# Patient Record
Sex: Female | Born: 1975 | Race: White | Hispanic: No | Marital: Single | Smoking: Former smoker
Health system: Southern US, Community
[De-identification: ages and names within clinical notes are randomized; demographics above are authoritative.]

## PROBLEM LIST (undated history)

## (undated) ENCOUNTER — Emergency Department (HOSPITAL_COMMUNITY): Discharge status: Absent without leave | Payer: 59

## (undated) DIAGNOSIS — J189 Pneumonia, unspecified organism: Secondary | ICD-10-CM

## (undated) DIAGNOSIS — F909 Attention-deficit hyperactivity disorder, unspecified type: Secondary | ICD-10-CM

## (undated) DIAGNOSIS — F419 Anxiety disorder, unspecified: Secondary | ICD-10-CM

## (undated) DIAGNOSIS — F319 Bipolar disorder, unspecified: Secondary | ICD-10-CM

## (undated) HISTORY — DX: Attention-deficit hyperactivity disorder, unspecified type: F90.9

## (undated) HISTORY — PX: CYST EXCISION: SHX5701

---

## 1999-09-01 ENCOUNTER — Emergency Department (HOSPITAL_COMMUNITY): Admission: EM | Admit: 1999-09-01 | Discharge: 1999-09-01 | Payer: Self-pay | Admitting: Emergency Medicine

## 1999-09-02 ENCOUNTER — Encounter: Payer: Self-pay | Admitting: *Deleted

## 1999-09-02 ENCOUNTER — Ambulatory Visit (HOSPITAL_COMMUNITY): Admission: AD | Admit: 1999-09-02 | Discharge: 1999-09-02 | Payer: Self-pay | Admitting: *Deleted

## 1999-09-02 ENCOUNTER — Encounter (INDEPENDENT_AMBULATORY_CARE_PROVIDER_SITE_OTHER): Payer: Self-pay

## 1999-11-11 ENCOUNTER — Ambulatory Visit (HOSPITAL_COMMUNITY): Admission: RE | Admit: 1999-11-11 | Discharge: 1999-11-11 | Payer: Self-pay | Admitting: Obstetrics and Gynecology

## 1999-11-20 HISTORY — PX: DILATION AND CURETTAGE OF UTERUS: SHX78

## 2002-05-24 ENCOUNTER — Emergency Department (HOSPITAL_COMMUNITY): Admission: EM | Admit: 2002-05-24 | Discharge: 2002-05-24 | Payer: Self-pay | Admitting: Emergency Medicine

## 2003-11-23 ENCOUNTER — Emergency Department (HOSPITAL_COMMUNITY): Admission: EM | Admit: 2003-11-23 | Discharge: 2003-11-23 | Payer: Self-pay

## 2010-02-24 ENCOUNTER — Emergency Department (HOSPITAL_COMMUNITY): Admission: EM | Admit: 2010-02-24 | Discharge: 2010-02-24 | Payer: Self-pay | Admitting: Family Medicine

## 2010-06-13 ENCOUNTER — Other Ambulatory Visit: Admission: RE | Admit: 2010-06-13 | Discharge: 2010-06-13 | Payer: Self-pay | Admitting: Family Medicine

## 2011-04-06 NOTE — Op Note (Signed)
[facility]  Patient:    Cathy Payne                        MRN: [id number] Proc. Date: 11/11/99 Adm. Date:  [id number] Attending:  Lendon Colonel                           Operative Report  PREOPERATIVE DIAGNOSIS:       Right Bartholins gland abscess.  POSTOPERATIVE DIAGNOSIS:      Right Bartholins gland abscess.  OPERATION:                    Marsupialization of Bartholins gland abscess.  SURGEON:                      Katherine Roan, M.D.  ASSISTANT:  ANESTHESIA:  ESTIMATED BLOOD LOSS:  DESCRIPTION OF PROCEDURE:     The patient was placed in the lithotomy position nd prepped and draped in the usual fashion.  A small oval segment of vagina was excised and the Bartholins gland abscess was opened and drained.  The vagina was then sutured in two places with 3-0 chromic suture.  Hemostasis was secure. The Bartholins was infiltrated with about 10 cc of 0.5% Marcaine with epinephrine and the needlepoint Bovie was used to cauterize the vaginal mucosa.  [name] tolerated this procedure well and was sent to the recovery room in good condition. DD:  11/11/99 TD:  11/12/99 Job: [id number]

## 2013-07-03 ENCOUNTER — Other Ambulatory Visit: Payer: Self-pay | Admitting: Family Medicine

## 2013-07-03 ENCOUNTER — Other Ambulatory Visit (HOSPITAL_COMMUNITY)
Admission: RE | Admit: 2013-07-03 | Discharge: 2013-07-03 | Disposition: A | Discharge status: Home / Self Care | Payer: 59 | Source: Ambulatory Visit | Attending: Family Medicine | Admitting: Family Medicine

## 2013-07-03 DIAGNOSIS — Z124 Encounter for screening for malignant neoplasm of cervix: Secondary | ICD-10-CM | POA: Insufficient documentation

## 2014-05-26 ENCOUNTER — Other Ambulatory Visit: Payer: Self-pay | Admitting: Nurse Practitioner

## 2014-05-26 DIAGNOSIS — Z1231 Encounter for screening mammogram for malignant neoplasm of breast: Secondary | ICD-10-CM

## 2014-06-09 ENCOUNTER — Ambulatory Visit
Admission: RE | Admit: 2014-06-09 | Discharge: 2014-06-09 | Disposition: A | Discharge status: Home / Self Care | Payer: 59 | Source: Ambulatory Visit | Attending: Nurse Practitioner | Admitting: Nurse Practitioner

## 2014-06-09 DIAGNOSIS — Z1231 Encounter for screening mammogram for malignant neoplasm of breast: Secondary | ICD-10-CM

## 2014-06-10 ENCOUNTER — Other Ambulatory Visit: Payer: Self-pay | Admitting: Nurse Practitioner

## 2014-06-10 DIAGNOSIS — R928 Other abnormal and inconclusive findings on diagnostic imaging of breast: Secondary | ICD-10-CM

## 2014-06-18 ENCOUNTER — Ambulatory Visit
Admission: RE | Admit: 2014-06-18 | Discharge: 2014-06-18 | Disposition: A | Discharge status: Home / Self Care | Payer: 59 | Source: Ambulatory Visit | Attending: Nurse Practitioner | Admitting: Nurse Practitioner

## 2014-06-18 DIAGNOSIS — R928 Other abnormal and inconclusive findings on diagnostic imaging of breast: Secondary | ICD-10-CM

## 2014-10-28 ENCOUNTER — Emergency Department (HOSPITAL_COMMUNITY)
Admission: EM | Admit: 2014-10-28 | Discharge: 2014-10-28 | Disposition: A | Discharge status: Home / Self Care | Payer: 59 | Attending: Emergency Medicine | Admitting: Emergency Medicine

## 2014-10-28 ENCOUNTER — Encounter (HOSPITAL_COMMUNITY): Payer: Self-pay | Admitting: *Deleted

## 2014-10-28 DIAGNOSIS — Z720 Tobacco use: Secondary | ICD-10-CM | POA: Diagnosis not present

## 2014-10-28 DIAGNOSIS — F419 Anxiety disorder, unspecified: Secondary | ICD-10-CM | POA: Diagnosis not present

## 2014-10-28 DIAGNOSIS — F3131 Bipolar disorder, current episode depressed, mild: Secondary | ICD-10-CM | POA: Diagnosis not present

## 2014-10-28 DIAGNOSIS — F329 Major depressive disorder, single episode, unspecified: Secondary | ICD-10-CM | POA: Diagnosis present

## 2014-10-28 HISTORY — DX: Bipolar disorder, unspecified: F31.9

## 2014-10-28 HISTORY — DX: Anxiety disorder, unspecified: F41.9

## 2014-10-28 LAB — CBC
HCT: 44.5 % (ref 36.0–46.0)
Hemoglobin: 15 g/dL (ref 12.0–15.0)
MCH: 29.9 pg (ref 26.0–34.0)
MCHC: 33.7 g/dL (ref 30.0–36.0)
MCV: 88.6 fL (ref 78.0–100.0)
PLATELETS: 276 10*3/uL (ref 150–400)
RBC: 5.02 MIL/uL (ref 3.87–5.11)
RDW: 12.6 % (ref 11.5–15.5)
WBC: 9.1 10*3/uL (ref 4.0–10.5)

## 2014-10-28 LAB — COMPREHENSIVE METABOLIC PANEL
ALT: 20 U/L (ref 0–35)
AST: 22 U/L (ref 0–37)
Albumin: 3.7 g/dL (ref 3.5–5.2)
Alkaline Phosphatase: 78 U/L (ref 39–117)
Anion gap: 13 (ref 5–15)
BUN: 12 mg/dL (ref 6–23)
CALCIUM: 9.2 mg/dL (ref 8.4–10.5)
CO2: 24 mEq/L (ref 19–32)
CREATININE: 0.92 mg/dL (ref 0.50–1.10)
Chloride: 96 mEq/L (ref 96–112)
GFR calc Af Amer: >90 mL/min (ref >90)
GFR calc non Af Amer: 79 mL/min — ABNORMAL LOW (ref >90)
Glucose, Bld: 87 mg/dL (ref 70–99)
Potassium: 3.7 mEq/L (ref 3.7–5.3)
SODIUM: 133 meq/L — AB (ref 137–147)
TOTAL PROTEIN: 7.9 g/dL (ref 6.0–8.3)
Total Bilirubin: 0.2 mg/dL — ABNORMAL LOW (ref 0.3–1.2)

## 2014-10-28 LAB — RAPID URINE DRUG SCREEN, HOSP PERFORMED
AMPHETAMINES: NOT DETECTED
Barbiturates: NOT DETECTED
Benzodiazepines: NOT DETECTED
Cocaine: NOT DETECTED
OPIATES: NOT DETECTED
Tetrahydrocannabinol: NOT DETECTED

## 2014-10-28 LAB — ACETAMINOPHEN LEVEL: Acetaminophen (Tylenol), Serum: <15 ug/mL (ref 10–30)

## 2014-10-28 LAB — ETHANOL

## 2014-10-28 LAB — SALICYLATE LEVEL: Salicylate Lvl: <2 mg/dL — ABNORMAL LOW (ref 2.8–20.0)

## 2014-10-28 MED ORDER — ACETAMINOPHEN 325 MG PO TABS: 650.0000 mg | ORAL_TABLET | ORAL | Status: DC | PRN

## 2014-10-28 MED ORDER — LORAZEPAM 1 MG PO TABS: 1.0000 mg | ORAL_TABLET | Freq: Three times a day (TID) | ORAL | Status: DC | PRN

## 2014-10-28 MED ORDER — ONDANSETRON HCL 4 MG PO TABS: 4.0000 mg | ORAL_TABLET | Freq: Three times a day (TID) | ORAL | Status: DC | PRN

## 2014-10-28 MED ORDER — NICOTINE 21 MG/24HR TD PT24: 21.0000 mg | MEDICATED_PATCH | Freq: Every day | TRANSDERMAL | Status: DC

## 2014-10-28 NOTE — BH Assessment (Signed)
Spoke with Elpidio AnisShari Upstill, PA-C prior to initiating assessment. Per Melvenia BeamShari, PA-C pt was upset at work today and reports she would have hurt her boss if she was able. Pt reports recent bipolar dx and being on medication for about 3 months, reports feeling stressed and at  A "breaking point."   Assessment to commence shortly.    Clista BernhardtNancy Tariyah Pendry, [facility] Triage Specialist 10/28/2014 4:11 AM
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
## 2014-10-28 NOTE — Discharge Instructions (Signed)

## 2014-10-28 NOTE — BH Assessment (Signed)
Relayed results of assessment to Donell SievertSpencer Simon, PA. Per Karleen HampshireSpencer, GeorgiaPA pt does not meet inpt criteria and can be discharged with OP resources.   Elpidio AnisShari Upstill, PA-C is in agreement with plan. Informed RN of plan.   Educated pt on how to access OP resources, what to expect from counseling and how to advocate for herself. Provided with OP resources. Discussed sx that would indicated pt should return to ED for assessment. She voiced understanding and is in agreement with plan.   Clista BernhardtNancy Syreeta Figler, [facility] Triage Specialist 10/28/2014 5:23 AM

## 2014-10-28 NOTE — ED Notes (Signed)
  AVS provided and reviewed. Understanding verbalized. Denied SI/HI/AVH. Denied pain. She offered no questions or concerns. Escorted off the unit, belongings returned and directed to the exit. Pt left in no acute distress. 

## 2014-10-28 NOTE — ED Notes (Signed)
Pt has been seen and wanded by security.  Pt has belongings.

## 2014-10-28 NOTE — ED Provider Notes (Signed)
CSN: [id number]     Arrival date & time 10/28/14  0104 History   First MD Initiated Contact with Patient 10/28/14 0326     Chief Complaint  Patient presents with  . Depression     (Consider location/radiation/quality/duration/timing/severity/associated sxs/prior Treatment) Patient is a 38 y.o. female presenting with mental health disorder. The history is provided by the patient. No language interpreter was used.  Mental Health Problem Presenting symptoms: agitation, depression and homicidal ideas   Presenting symptoms: no suicidal thoughts   Associated symptoms: anxiety   Associated symptoms comment:  She reports feelings of depression, hypersomnolence, prone to fits of anger and stress. Several days ago she had an episode of anger aimed at her employer and feels she would have hurt him if she had been able. She is followed by Dr. Toni ArthursFuller and treated for Bipolar and anxiety but does not feel the medications as helping with her current symptoms. No substance abuse issues. No suicidal ideations.   Past Medical History  Diagnosis Date  . Bipolar 1 disorder   . Anxiety    History reviewed. No pertinent past surgical history. No family history on file. History  Substance Use Topics  . Smoking status: Current Every Day Smoker  . Smokeless tobacco: Not on file  . Alcohol Use: No   OB History    No data available     Review of Systems  Constitutional: Negative for fever and chills.  HENT: Negative.   Respiratory: Negative.   Cardiovascular: Negative.   Gastrointestinal: Negative.   Musculoskeletal: Negative.   Skin: Negative.   Neurological: Negative.   Psychiatric/Behavioral: Positive for homicidal ideas, dysphoric mood and agitation. Negative for suicidal ideas. The patient is nervous/anxious.       Allergies  Review of patient's allergies indicates no known allergies.  Home Medications   Prior to Admission medications   Not on File   BP 145/94 mmHg  Pulse 116   Resp 18  SpO2 100% Physical Exam  Constitutional: She appears well-developed and well-nourished.  HENT:  Head: Normocephalic.  Neck: Normal range of motion. Neck supple.  Cardiovascular: Normal rate and regular rhythm.   Pulmonary/Chest: Effort normal and breath sounds normal.  Abdominal: Soft. Bowel sounds are normal. There is no tenderness. There is no rebound and no guarding.  Musculoskeletal: Normal range of motion.  Neurological: She is alert. No cranial nerve deficit.  Skin: Skin is warm and dry. No rash noted.  Psychiatric: Her speech is normal. Cognition and memory are normal. She exhibits a depressed mood.    ED Course  Procedures (including critical care time) Labs Review Labs Reviewed  COMPREHENSIVE METABOLIC PANEL - Abnormal; Notable for the following:    Sodium 133 (*)    Total Bilirubin 0.2 (*)    GFR calc non Af Amer 79 (*)    All other components within normal limits  SALICYLATE LEVEL - Abnormal; Notable for the following:    Salicylate Lvl <2.0 (*)    All other components within normal limits  ACETAMINOPHEN LEVEL  CBC  ETHANOL  URINE RAPID DRUG SCREEN (HOSP PERFORMED)    Imaging Review No results found.   EKG Interpretation None      MDM   Final diagnoses:  None    1. Bipolar  Will obtain TTS consultation to determine safety for discharge.  TTS consultation:  Patient stable for discharge with outpatient resources and PCP Toni Arthurs(Fuller) follow up. Able to contract for safety.     Arnoldo HookerShari A Ichelle Harral, PA-C  10/28/14 0404  Arnoldo HookerShari A Brantly Kalman, PA-C 10/28/14 [id number]  Hanley SeamenJohn L Molpus, MD 10/28/14 [phone]
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
## 2015-01-03 ENCOUNTER — Ambulatory Visit (HOSPITAL_COMMUNITY): Payer: Self-pay | Admitting: Clinical

## 2015-01-17 ENCOUNTER — Ambulatory Visit (HOSPITAL_COMMUNITY): Payer: Self-pay | Admitting: Clinical

## 2015-02-02 ENCOUNTER — Encounter (INDEPENDENT_AMBULATORY_CARE_PROVIDER_SITE_OTHER): Payer: Self-pay

## 2015-02-02 ENCOUNTER — Ambulatory Visit (INDEPENDENT_AMBULATORY_CARE_PROVIDER_SITE_OTHER): Payer: 59 | Admitting: Clinical

## 2015-02-02 ENCOUNTER — Encounter (HOSPITAL_COMMUNITY): Payer: Self-pay | Admitting: Clinical

## 2015-02-02 DIAGNOSIS — F319 Bipolar disorder, unspecified: Secondary | ICD-10-CM | POA: Diagnosis not present

## 2015-02-02 DIAGNOSIS — F909 Attention-deficit hyperactivity disorder, unspecified type: Secondary | ICD-10-CM | POA: Diagnosis not present

## 2015-02-02 DIAGNOSIS — F988 Other specified behavioral and emotional disorders with onset usually occurring in childhood and adolescence: Secondary | ICD-10-CM

## 2015-02-02 DIAGNOSIS — F411 Generalized anxiety disorder: Secondary | ICD-10-CM
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
## 2015-02-15 ENCOUNTER — Ambulatory Visit (INDEPENDENT_AMBULATORY_CARE_PROVIDER_SITE_OTHER): Payer: 59 | Admitting: Clinical

## 2015-02-15 ENCOUNTER — Encounter (HOSPITAL_COMMUNITY): Payer: Self-pay | Admitting: Clinical

## 2015-02-15 DIAGNOSIS — F411 Generalized anxiety disorder: Secondary | ICD-10-CM

## 2015-02-15 DIAGNOSIS — F319 Bipolar disorder, unspecified: Secondary | ICD-10-CM

## 2015-02-15 DIAGNOSIS — F909 Attention-deficit hyperactivity disorder, unspecified type: Secondary | ICD-10-CM | POA: Diagnosis not present

## 2015-02-15 DIAGNOSIS — F988 Other specified behavioral and emotional disorders with onset usually occurring in childhood and adolescence: Secondary | ICD-10-CM

## 2015-02-15 NOTE — Progress Notes (Signed)
   THERAPIST PROGRESS NOTE  Session Time: 2:30 -3:30  Participation Level: Active  Behavioral Response: CasualAlertAnxious  Type of Therapy: Individual Therapy  Treatment Goals addressed: improve psychiatric symptoms, emotional regulation skills, interpersonal relationship skills, stress management skills  Interventions:  Motivational interviewing: Mindfulness and Grounding  Summary: Cathy Payne is a 39 y.o. female who presents with Bipolar I, ADD, and Generalized Anxiety Disorder  Suicidal/Homicidal: No -without intent/plan  Therapist Response: [name] met with clinician for an individual session. She discussed her psychiatric symptoms and  her current life events. [name] shared that she had a challenging week and was ready to get to work to improve her symptoms. Client and clinician discussed the skills  That they were going to be working on. Clinician introduced grounding techniques the process and their purpose. Clinician introduced some of the techniques and client and clinician practiced them together. [name] asked clarifying questions which clinician answered. Clinician also introduced the concept of a gratitude journal. Client and clinician discussed the purpose and technique. Clinician explained how [name] could get the most therapeutic value out of the practice. Client and clinician discussed one of [name] current gratitudes and client and clinician used it as an example to clarify the practice. [name] agreed to practice grounding techniques and to practice the gratitude journal until next session.  Plan: Return again in 1-2 weeks.  Diagnosis: Axis I: Bipolar I, ADD, and Generalized Anxiety Disorder    Wilton Thrall A, LCSW 02/15/2015   E [id number]

## 2015-02-28 ENCOUNTER — Ambulatory Visit (HOSPITAL_COMMUNITY): Payer: Self-pay | Admitting: Clinical

## 2015-03-07 ENCOUNTER — Ambulatory Visit (HOSPITAL_COMMUNITY): Payer: Self-pay | Admitting: Clinical

## 2015-03-14 ENCOUNTER — Ambulatory Visit (HOSPITAL_COMMUNITY): Payer: Self-pay | Admitting: Clinical

## 2015-03-21 ENCOUNTER — Ambulatory Visit (HOSPITAL_COMMUNITY): Payer: Self-pay | Admitting: Clinical

## 2015-03-28 ENCOUNTER — Ambulatory Visit (HOSPITAL_COMMUNITY): Payer: Self-pay | Admitting: Clinical

## 2015-04-14 ENCOUNTER — Emergency Department: Payer: Worker's Compensation

## 2015-04-14 ENCOUNTER — Emergency Department
Admission: EM | Admit: 2015-04-14 | Discharge: 2015-04-14 | Disposition: A | Discharge status: Home / Self Care | Payer: Worker's Compensation | Attending: Emergency Medicine | Admitting: Emergency Medicine

## 2015-04-14 DIAGNOSIS — Y9389 Activity, other specified: Secondary | ICD-10-CM | POA: Insufficient documentation

## 2015-04-14 DIAGNOSIS — S60021A Contusion of right index finger without damage to nail, initial encounter: Secondary | ICD-10-CM | POA: Insufficient documentation

## 2015-04-14 DIAGNOSIS — Y9289 Other specified places as the place of occurrence of the external cause: Secondary | ICD-10-CM | POA: Diagnosis not present

## 2015-04-14 DIAGNOSIS — Z79899 Other long term (current) drug therapy: Secondary | ICD-10-CM | POA: Insufficient documentation

## 2015-04-14 DIAGNOSIS — Z720 Tobacco use: Secondary | ICD-10-CM | POA: Insufficient documentation

## 2015-04-14 DIAGNOSIS — Y990 Civilian activity done for income or pay: Secondary | ICD-10-CM | POA: Diagnosis not present

## 2015-04-14 DIAGNOSIS — S60031A Contusion of right middle finger without damage to nail, initial encounter: Secondary | ICD-10-CM | POA: Diagnosis not present

## 2015-04-14 DIAGNOSIS — S60221A Contusion of right hand, initial encounter: Secondary | ICD-10-CM

## 2015-04-14 DIAGNOSIS — W2209XA Striking against other stationary object, initial encounter: Secondary | ICD-10-CM | POA: Diagnosis not present

## 2015-04-14 DIAGNOSIS — S6991XA Unspecified injury of right wrist, hand and finger(s), initial encounter: Secondary | ICD-10-CM | POA: Diagnosis present

## 2015-04-14 MED ORDER — IBUPROFEN 800 MG PO TABS: 800.0000 mg | ORAL_TABLET | Freq: Three times a day (TID) | ORAL | Status: AC | PRN

## 2015-04-14 MED ORDER — TRAMADOL HCL 50 MG PO TABS: 50.0000 mg | ORAL_TABLET | Freq: Three times a day (TID) | ORAL | Status: AC | PRN

## 2015-04-14 NOTE — ED Notes (Signed)
WC completed and delivered to lab for courier p/u. 

## 2015-04-14 NOTE — ED Notes (Signed)
Patient ambulatory to triage with steady gait, without difficulty or distress noted; pt st at 415pm, while at work cut finger on wire rack, lac between 2nd and 3rd right fingers; employed at [employer]

## 2015-04-14 NOTE — ED Provider Notes (Signed)
[facility] Emergency Department Provider Note  ____________________________________________  Time seen: Approximately 8:00 AM  I have reviewed the triage vital signs and the nursing notes.   HISTORY  Chief Complaint Hand pain   HPI Cathy Payne is a 39 y.o. female resents to the ER for complaints of right hand pain. Patient states that early this morning she was at work and she reached to toss a specimen and her right hand between her second and third fingers hit a wire rack. States pain since injury. Denies fall or other injury. Denies laceration or cut.  Patient states that the pain is improved since injury, states pain is now 5 out of 10. Patient states that is aching pain and feels tight with swelling. Denies tingling or numbness. Reports right handed.   Past Medical History  Diagnosis Date  . Bipolar 1 disorder   . Anxiety   . ADHD (attention deficit hyperactivity disorder)     There are no active problems to display for this patient.   No past surgical history on file.  Current Outpatient Rx  Name  Route  Sig  Dispense  Refill  . amphetamine-dextroamphetamine (ADDERALL XR) 20 MG 24 hr capsule   Oral   Take 20 mg by mouth every morning.      0   . buPROPion (WELLBUTRIN XL) 300 MG 24 hr tablet   Oral   Take 300 mg by mouth daily.      1   . risperiDONE (RISPERDAL) 1 MG tablet   Oral   Take 1 mg by mouth daily.      1   . zolpidem (AMBIEN CR) 12.5 MG CR tablet   Oral   Take 12.5 mg by mouth at bedtime as needed.      5     Allergies Review of patient's allergies indicates no known allergies.  Family History  Problem Relation Age of Onset  . Depression Mother   . Bipolar disorder Sister     Social History History  Substance Use Topics  . Smoking status: Current Every Day Smoker  . Smokeless tobacco: Not on file  . Alcohol Use: No    Review of Systems Constitutional: No fever/chills Eyes: No visual  changes. ENT: No sore throat. Cardiovascular: Denies chest pain. Respiratory: Denies shortness of breath. Gastrointestinal: No abdominal pain.  No nausea, no vomiting.  No diarrhea.  No constipation. Genitourinary: Negative for dysuria. Musculoskeletal: Negative for back pain. Right hand pain as above  Skin: Negative for rash. Neurological: Negative for headaches, focal weakness or numbness.  10-point ROS otherwise negative.  ____________________________________________   PHYSICAL EXAM:  VITAL SIGNS: ED Triage Vitals  Enc Vitals Group     BP 04/14/15 0219 150/91 mmHg     Pulse Rate 04/14/15 0219 100     Resp 04/14/15 0219 18     Temp 04/14/15 0219 97.9 F (36.6 C)     Temp Source 04/14/15 0219 Oral     SpO2 04/14/15 0219 99 %     Weight 04/14/15 0219 220 lb (99.791 kg)     Height 04/14/15 0219  (1.727 m)     Head Cir --      Peak Flow --      Pain Score 04/14/15 0218 6     Pain Loc --      Pain Edu? --      Excl. in GC? --    Blood pressure 123/77, pulse 95, temperature 98.1 F (36.7  C), temperature source Oral, resp. rate 18, height 5\' 8"  (1.727 m), weight 220 lb (99.791 kg), last menstrual period 02/12/2015, SpO2 96 %.  Constitutional: Alert and oriented. Well appearing and in no acute distress. Eyes: Conjunctivae are normal. PERRL.  Head: Atraumatic. Nose: No congestion/rhinnorhea.  Mouth/Throat: Mucous membranes are moist.   Neck: No stridor. No cervical spine tenderness to palpation Hematological/Lymphatic/Immunilogical: No cervical lymphadenopathy. Cardiovascular: Normal rate, regular rhythm. Grossly normal heart sounds.  Good peripheral circulation. Respiratory: Normal respiratory effort.  No retractions. Lungs CTAB. Gastrointestinal: Soft and nontender. No distention. No abdominal bruits. No CVA tenderness. Musculoskeletal: No lower extremity tenderness nor edema.  No joint effusions.Right hand pain between 2/3 MCP mod TTP, mild swelling. Skin intact Full  ROM. Grips equal bilaterally. Distal radial pulses equal bilaterally Neurologic:  Normal speech and language. No gross focal neurologic deficits are appreciated. Speech is normal. No gait instability. Skin:  Skin is warm, dry and intact. No rash noted. Psychiatric: Mood and affect are normal. Speech and behavior are normal.   RADIOLOGY  RIGHT HAND - COMPLETE 3+ VIEW  COMPARISON: None.  FINDINGS: There is no evidence of fracture or dislocation. There is no evidence of arthropathy or other focal bone abnormality. Soft tissues are unremarkable.  IMPRESSION: No acute abnormality noted.   Electronically Signed By: Alcide CleverMark Lukens M.D. On: 04/14/2015 09:41       ____________________________________________   PROCEDURES  Procedure(s) performed: SPLINT APPLICATION Date/Time: 10:15 AM Authorized by: Renford DillsLindsey Meshelle Holness Consent: Verbal consent obtained. Risks and benefits: risks, benefits and alternatives were discussed Consent given by: patient Splint applied by: ed technician Location details: right 2/3 fingers Splint type: soft finger splint Then 2/3 fingers buddy taped Post-procedure: The splinted body part was neurovascularly unchanged following the procedure. Patient tolerance: Patient tolerated the procedure well with no immediate complications.   ____________________________________________   INITIAL IMPRESSION / ASSESSMENT AND PLAN / ED COURSE  Pertinent labs & imaging results that were available during my care of the patient were reviewed by me and considered in my medical decision making (see chart for details).  No acute distress. Very well-appearing patient. Presents for complaints of right hand pain between second and third MCP. No acute abnormality noted on x-ray. Patient to ice and elevate. Buddy tape and finger splint for 2 days or as long as pain continues. Follow up with orthopedic as needed for continued pain. Discussed follow-up and return  parameters. ____________________________________________   FINAL CLINICAL IMPRESSION(S) / ED DIAGNOSES  Final diagnoses:  Hand contusion, right, initial encounter      Renford DillsLindsey Taiwana Willison, NP 04/14/15 1016  Richardean Canalavid H Yao, MD 04/16/15 [phone]

## 2015-04-14 NOTE — ED Notes (Signed)
Pt presents with injury to right pointer and middle finger. No lacerations or open injury. Pt reporting pain and swelling. Pt unable to bend fingers and reporting pain increasing throughout arm.

## 2015-04-14 NOTE — Discharge Instructions (Signed)
Take medication as prescribed. Apply ice and elevate. Keep finger and finger splint and buddy tape for 2 days or as long as pain continues.  Follow up next week with orthopedic as needed for continued pain.  Return to the ER for new or worsening concerns.  Contusion A contusion is a deep bruise. Contusions happen when an injury causes bleeding under the skin. Signs of bruising include pain, puffiness (swelling), and discolored skin. The contusion may turn blue, purple, or yellow. HOME CARE   Put ice on the injured area.  Put ice in a plastic bag.  Place a towel between your skin and the bag.  Leave the ice on for 15-20 minutes, 03-04 times a day.  Only take medicine as told by your doctor.  Rest the injured area.  If possible, raise (elevate) the injured area to lessen puffiness. GET HELP RIGHT AWAY IF:   You have more bruising or puffiness.  You have pain that is getting worse.  Your puffiness or pain is not helped by medicine. MAKE SURE YOU:   Understand these instructions.  Will watch your condition.  Will get help right away if you are not doing well or get worse. Document Released: 04/23/2008 Document Revised: 01/28/2012 Document Reviewed: 09/10/2011 [facility] Patient Information 2015 [place] [employer] [place]. This information is not intended to replace advice given to you by your health care provider. Make sure you discuss any questions you have with your health care provider.

## 2015-06-07 ENCOUNTER — Other Ambulatory Visit: Payer: Self-pay

## 2015-06-07 ENCOUNTER — Encounter: Payer: Self-pay | Admitting: Emergency Medicine

## 2015-06-07 ENCOUNTER — Emergency Department
Admission: EM | Admit: 2015-06-07 | Discharge: 2015-06-07 | Disposition: A | Discharge status: Home / Self Care | Payer: 59 | Attending: Emergency Medicine | Admitting: Emergency Medicine

## 2015-06-07 DIAGNOSIS — N390 Urinary tract infection, site not specified: Secondary | ICD-10-CM | POA: Insufficient documentation

## 2015-06-07 DIAGNOSIS — Z79899 Other long term (current) drug therapy: Secondary | ICD-10-CM | POA: Insufficient documentation

## 2015-06-07 DIAGNOSIS — M6281 Muscle weakness (generalized): Secondary | ICD-10-CM | POA: Diagnosis present

## 2015-06-07 DIAGNOSIS — R112 Nausea with vomiting, unspecified: Secondary | ICD-10-CM | POA: Diagnosis not present

## 2015-06-07 DIAGNOSIS — Z3202 Encounter for pregnancy test, result negative: Secondary | ICD-10-CM | POA: Insufficient documentation

## 2015-06-07 DIAGNOSIS — Z720 Tobacco use: Secondary | ICD-10-CM | POA: Insufficient documentation

## 2015-06-07 LAB — CBC
HCT: 44.9 % (ref 35.0–47.0)
HEMOGLOBIN: 15.2 g/dL (ref 12.0–16.0)
MCH: 29.4 pg (ref 26.0–34.0)
MCHC: 33.8 g/dL (ref 32.0–36.0)
MCV: 86.8 fL (ref 80.0–100.0)
PLATELETS: 265 10*3/uL (ref 150–440)
RBC: 5.17 MIL/uL (ref 3.80–5.20)
RDW: 13.5 % (ref 11.5–14.5)
WBC: 9.2 10*3/uL (ref 3.6–11.0)

## 2015-06-07 LAB — URINALYSIS COMPLETE WITH MICROSCOPIC (ARMC ONLY)
BILIRUBIN URINE: NEGATIVE
GLUCOSE, UA: NEGATIVE mg/dL
Ketones, ur: NEGATIVE mg/dL
NITRITE: POSITIVE — AB
PH: 6 (ref 5.0–8.0)
PROTEIN: 30 mg/dL — AB
SPECIFIC GRAVITY, URINE: 1.024 (ref 1.005–1.030)

## 2015-06-07 LAB — COMPREHENSIVE METABOLIC PANEL
ALBUMIN: 4 g/dL (ref 3.5–5.0)
ALK PHOS: 68 U/L (ref 38–126)
ALT: 21 U/L (ref 14–54)
ANION GAP: 9 (ref 5–15)
AST: 29 U/L (ref 15–41)
BILIRUBIN TOTAL: 0.7 mg/dL (ref 0.3–1.2)
BUN: 11 mg/dL (ref 6–20)
CHLORIDE: 107 mmol/L (ref 101–111)
CO2: 22 mmol/L (ref 22–32)
Calcium: 9 mg/dL (ref 8.9–10.3)
Creatinine, Ser: 0.88 mg/dL (ref 0.44–1.00)
GFR calc Af Amer: >60 mL/min (ref >60)
GFR calc non Af Amer: >60 mL/min (ref >60)
Glucose, Bld: 115 mg/dL — ABNORMAL HIGH (ref 65–99)
Potassium: 3.3 mmol/L — ABNORMAL LOW (ref 3.5–5.1)
Sodium: 138 mmol/L (ref 135–145)
Total Protein: 7.6 g/dL (ref 6.5–8.1)

## 2015-06-07 LAB — LIPASE, BLOOD: Lipase: 25 U/L (ref 22–51)

## 2015-06-07 LAB — POCT PREGNANCY, URINE: PREG TEST UR: NEGATIVE

## 2015-06-07 MED ORDER — ONDANSETRON 4 MG PO TBDP: 4.0000 mg | ORAL_TABLET | Freq: Once | ORAL | Status: DC | PRN

## 2015-06-07 MED ORDER — DEXTROSE 5 % IV SOLN
1.0000 g | Freq: Once | INTRAVENOUS | Status: AC
  Administered 2015-06-07: 1 g via INTRAVENOUS
  Filled 2015-06-07: qty 10

## 2015-06-07 MED ORDER — PHENAZOPYRIDINE HCL 100 MG PO TABS: 100.0000 mg | ORAL_TABLET | Freq: Three times a day (TID) | ORAL | Status: AC | PRN

## 2015-06-07 MED ORDER — SODIUM CHLORIDE 0.9 % IV BOLUS (SEPSIS)
1000.0000 mL | Freq: Once | INTRAVENOUS | Status: AC
  Administered 2015-06-07: 1000 mL via INTRAVENOUS

## 2015-06-07 MED ORDER — ONDANSETRON HCL 4 MG/2ML IJ SOLN
4.0000 mg | Freq: Once | INTRAMUSCULAR | Status: AC
  Administered 2015-06-07: 4 mg via INTRAVENOUS
  Filled 2015-06-07: qty 2

## 2015-06-07 MED ORDER — ONDANSETRON 4 MG PO TBDP: 4.0000 mg | ORAL_TABLET | Freq: Three times a day (TID) | ORAL | Status: DC | PRN

## 2015-06-07 MED ORDER — CEFTRIAXONE SODIUM IN DEXTROSE 20 MG/ML IV SOLN: 1.0000 g | Freq: Once | INTRAVENOUS | Status: DC

## 2015-06-07 MED ORDER — CEPHALEXIN 500 MG PO CAPS: 500.0000 mg | ORAL_CAPSULE | Freq: Three times a day (TID) | ORAL | Status: AC

## 2015-06-07 NOTE — ED Provider Notes (Signed)
[facility] Emergency Department Provider Note  Time seen: 4:18 PM  I have reviewed the triage vital signs and the nursing notes.   HISTORY  Chief Complaint Emesis and Weakness    HPI Cathy Payne is a 39 y.o. female with a past medical history of bipolar, anxiety, ADHD presents the emergency department for generalized weakness times one week, now with nausea and vomiting today. According to the patient she has felt generalized weakness for the past one week. She states her weakness is worse with exertion especially while she is outside working in the heat. Patient states today not only did she feel weak but she also feels nauseated, had several episodes of vomiting. Denies any chest pain, shortness of breath, abdominal pain. Patient does state some mild diarrhea today as well. Denies fever.     Past Medical History  Diagnosis Date  . Bipolar 1 disorder   . Anxiety   . ADHD (attention deficit hyperactivity disorder)     There are no active problems to display for this patient.   Past Surgical History  Procedure Laterality Date  . Dilation and curettage of uterus  2001  . Cyst excision      Current Outpatient Rx  Name  Route  Sig  Dispense  Refill  . amphetamine-dextroamphetamine (ADDERALL XR) 20 MG 24 hr capsule   Oral   Take 20 mg by mouth every morning.      0   . buPROPion (WELLBUTRIN XL) 300 MG 24 hr tablet   Oral   Take 300 mg by mouth daily.      1   . ibuprofen (ADVIL,MOTRIN) 800 MG tablet   Oral   Take 1 tablet (800 mg total) by mouth every 8 (eight) hours as needed for mild pain or moderate pain.   15 tablet   0   . risperiDONE (RISPERDAL) 1 MG tablet   Oral   Take 1 mg by mouth daily.      1   . traMADol (ULTRAM) 50 MG tablet   Oral   Take 1 tablet (50 mg total) by mouth every 8 (eight) hours as needed (Do not drive or operate machinery while taking as can cause drowsiness.).   12 tablet   0   . zolpidem (AMBIEN  CR) 12.5 MG CR tablet   Oral   Take 12.5 mg by mouth at bedtime as needed.      5     Allergies Review of patient's allergies indicates no known allergies.  Family History  Problem Relation Age of Onset  . Depression Mother   . Bipolar disorder Sister     Social History History  Substance Use Topics  . Smoking status: Current Every Day Smoker -- 1.00 packs/day    Types: Cigarettes  . Smokeless tobacco: Not on file  . Alcohol Use: No    Review of Systems Constitutional: Negative for fever. Cardiovascular: Negative for chest pain. Respiratory: Negative for shortness of breath. Gastrointestinal: Negative for abdominal pain. Positive for nausea and vomiting. Genitourinary: Negative for dysuria. Negative for urgency or frequency. Musculoskeletal: Negative for back pain. 10-point ROS otherwise negative.  ____________________________________________   PHYSICAL EXAM:  VITAL SIGNS: ED Triage Vitals  Enc Vitals Group     BP 06/07/15 1524 118/74 mmHg     Pulse Rate 06/07/15 1524 83     Resp 06/07/15 1524 16     Temp 06/07/15 1524 97.7 F (36.5 C)     Temp Source 06/07/15  1524 Oral     SpO2 06/07/15 1524 99 %     Weight 06/07/15 1524 200 lb (90.719 kg)     Height 06/07/15 1524  (1.753 m)     Head Cir --      Peak Flow --      Pain Score 06/07/15 1524 0     Pain Loc --      Pain Edu? --      Excl. in GC? --     Constitutional: Alert and oriented. Well appearing and in no distress. Eyes: Normal exam ENT   Mouth/Throat: Mucous membranes are moist. Cardiovascular: Normal rate, regular rhythm. No murmur Respiratory: Normal respiratory effort without tachypnea nor retractions. Breath sounds are clear and equal bilaterally. No wheezes/rales/rhonchi. Gastrointestinal: Soft and nontender. No distention. No CVA tenderness palpation. Musculoskeletal: Nontender with normal range of motion in all extremities. No lower extremity tenderness or edema. Neurologic:   Normal speech and language. No gross focal neurologic deficits  Skin:  Skin is warm, dry and intact.  Psychiatric: Mood and affect are normal. Speech and behavior are normal.  ____________________________________________    EKG  EKG reviewed and interpreted by myself shows normal sinus rhythm at 73 bpm, narrow QRS, normal axis, normal intervals, no ST changes present. Normal EKG. ____________________________________________    INITIAL IMPRESSION / ASSESSMENT AND PLAN / ED COURSE  Pertinent labs & imaging results that were available during my care of the patient were reviewed by me and considered in my medical decision making (see chart for details).  Patient with generalized weakness times one week, now with nausea and vomiting today. Patient's labs are significant for a urinary tract infection, otherwise within normal limits. We will IV hydrate, treat nausea, and start Rocephin to treat her UTI. I've added on a urine culture to help further evaluate. Patient to follow up with her primary care doctor. Patient agreeable to plan. ____________________________________________   FINAL CLINICAL IMPRESSION(S) / ED DIAGNOSES  urinary tract infection Nausea and vomiting Generalized weakness   Minna Antis, MD 06/07/15 1621

## 2015-06-07 NOTE — ED Notes (Signed)
Pt reports vomiting all day today, reports weakness; states "I feel like I'm just gonna fall out". Pt reports getting really hot last night at work, headache. Pt also reports diarrhea last week and weekend.

## 2015-06-07 NOTE — ED Notes (Signed)
Today developed n/v, headache

## 2015-06-07 NOTE — Discharge Instructions (Signed)
Urinary Tract Infection Urinary tract infections (UTIs) can develop anywhere along your urinary tract. Your urinary tract is your body's drainage system for removing wastes and extra water. Your urinary tract includes two kidneys, two ureters, a bladder, and a urethra. Your kidneys are a pair of bean-shaped organs. Each kidney is about the size of your fist. They are located below your ribs, one on each side of your spine. CAUSES Infections are caused by microbes, which are microscopic organisms, including fungi, viruses, and bacteria. These organisms are so small that they can only be seen through a microscope. Bacteria are the microbes that most commonly cause UTIs. SYMPTOMS  Symptoms of UTIs may vary by age and gender of the patient and by the location of the infection. Symptoms in young women typically include a frequent and intense urge to urinate and a painful, burning feeling in the bladder or urethra during urination. Older women and men are more likely to be tired, shaky, and weak and have muscle aches and abdominal pain. A fever may mean the infection is in your kidneys. Other symptoms of a kidney infection include pain in your back or sides below the ribs, nausea, and vomiting. DIAGNOSIS To diagnose a UTI, your caregiver will ask you about your symptoms. Your caregiver also will ask to provide a urine sample. The urine sample will be tested for bacteria and white blood cells. White blood cells are made by your body to help fight infection. TREATMENT  Typically, UTIs can be treated with medication. Because most UTIs are caused by a bacterial infection, they usually can be treated with the use of antibiotics. The choice of antibiotic and length of treatment depend on your symptoms and the type of bacteria causing your infection. HOME CARE INSTRUCTIONS  If you were prescribed antibiotics, take them exactly as your caregiver instructs you. Finish the medication even if you feel better after you  have only taken some of the medication.  Drink enough water and fluids to keep your urine clear or pale yellow.  Avoid caffeine, tea, and carbonated beverages. They tend to irritate your bladder.  Empty your bladder often. Avoid holding urine for long periods of time.  Empty your bladder before and after sexual intercourse.  After a bowel movement, women should cleanse from front to back. Use each tissue only once. SEEK MEDICAL CARE IF:   You have back pain.  You develop a fever.  Your symptoms do not begin to resolve within 3 days. SEEK IMMEDIATE MEDICAL CARE IF:   You have severe back pain or lower abdominal pain.  You develop chills.  You have nausea or vomiting.  You have continued burning or discomfort with urination. MAKE SURE YOU:   Understand these instructions.  Will watch your condition.  Will get help right away if you are not doing well or get worse. Document Released: 08/15/2005 Document Revised: 05/06/2012 Document Reviewed: 12/14/2011 [facility] Patient Information 2015 [employer] [place]. This information is not intended to replace advice given to you by your health care provider. Make sure you discuss any questions you have with your health care provider.     As we have discussed your workup today is most consistent with a urinary tract infection. Please take your prescribed antibiotics for their entire course. Please follow up your primary care doctor in next 2-3 days for recheck. Return to the emergency department for any abdominal pain, fever, continued nausea/vomiting, or any other symptom personally concerning to yourself.

## 2019-09-23 ENCOUNTER — Emergency Department (HOSPITAL_COMMUNITY)
Admission: EM | Admit: 2019-09-23 | Discharge: 2019-09-24 | Disposition: A | Discharge status: Home / Self Care | Payer: 59 | Attending: Emergency Medicine | Admitting: Emergency Medicine

## 2019-09-23 ENCOUNTER — Emergency Department (HOSPITAL_COMMUNITY): Payer: 59

## 2019-09-23 ENCOUNTER — Other Ambulatory Visit: Payer: Self-pay

## 2019-09-23 ENCOUNTER — Encounter (HOSPITAL_COMMUNITY): Payer: Self-pay | Admitting: Emergency Medicine

## 2019-09-23 DIAGNOSIS — H6692 Otitis media, unspecified, left ear: Secondary | ICD-10-CM | POA: Insufficient documentation

## 2019-09-23 DIAGNOSIS — R0602 Shortness of breath: Secondary | ICD-10-CM | POA: Diagnosis present

## 2019-09-23 DIAGNOSIS — Z20828 Contact with and (suspected) exposure to other viral communicable diseases: Secondary | ICD-10-CM | POA: Insufficient documentation

## 2019-09-23 DIAGNOSIS — F909 Attention-deficit hyperactivity disorder, unspecified type: Secondary | ICD-10-CM | POA: Insufficient documentation

## 2019-09-23 DIAGNOSIS — F319 Bipolar disorder, unspecified: Secondary | ICD-10-CM | POA: Diagnosis not present

## 2019-09-23 DIAGNOSIS — R0789 Other chest pain: Secondary | ICD-10-CM | POA: Diagnosis not present

## 2019-09-23 DIAGNOSIS — F17210 Nicotine dependence, cigarettes, uncomplicated: Secondary | ICD-10-CM | POA: Diagnosis not present

## 2019-09-23 DIAGNOSIS — H6690 Otitis media, unspecified, unspecified ear: Secondary | ICD-10-CM

## 2019-09-23 DIAGNOSIS — Z79899 Other long term (current) drug therapy: Secondary | ICD-10-CM | POA: Diagnosis not present

## 2019-09-23 DIAGNOSIS — R059 Cough, unspecified: Secondary | ICD-10-CM

## 2019-09-23 DIAGNOSIS — R05 Cough: Secondary | ICD-10-CM | POA: Insufficient documentation

## 2019-09-23 NOTE — ED Triage Notes (Signed)
Patient reports SOB , dry cough , chills and chest tightness onset this week , denies fever , diagnosed with pneumonia last month prescribed with antibiotic with no improvement .

## 2019-09-24 ENCOUNTER — Emergency Department (HOSPITAL_COMMUNITY): Payer: 59

## 2019-09-24 LAB — CBC WITH DIFFERENTIAL/PLATELET
Abs Immature Granulocytes: 0.07 10*3/uL (ref 0.00–0.07)
Basophils Absolute: 0.1 10*3/uL (ref 0.0–0.1)
Basophils Relative: 1 %
Eosinophils Absolute: 0.1 10*3/uL (ref 0.0–0.5)
Eosinophils Relative: 1 %
HCT: 46.7 % — ABNORMAL HIGH (ref 36.0–46.0)
Hemoglobin: 15.2 g/dL — ABNORMAL HIGH (ref 12.0–15.0)
Immature Granulocytes: 1 %
Lymphocytes Relative: 29 %
Lymphs Abs: 4.4 10*3/uL — ABNORMAL HIGH (ref 0.7–4.0)
MCH: 29.5 pg (ref 26.0–34.0)
MCHC: 32.5 g/dL (ref 30.0–36.0)
MCV: 90.7 fL (ref 80.0–100.0)
Monocytes Absolute: 0.9 10*3/uL (ref 0.1–1.0)
Monocytes Relative: 6 %
Neutro Abs: 9.5 10*3/uL — ABNORMAL HIGH (ref 1.7–7.7)
Neutrophils Relative %: 62 %
Platelets: 323 10*3/uL (ref 150–400)
RBC: 5.15 MIL/uL — ABNORMAL HIGH (ref 3.87–5.11)
RDW: 12.5 % (ref 11.5–15.5)
WBC: 15 10*3/uL — ABNORMAL HIGH (ref 4.0–10.5)
nRBC: 0 % (ref 0.0–0.2)

## 2019-09-24 LAB — BASIC METABOLIC PANEL
Anion gap: 11 (ref 5–15)
BUN: 17 mg/dL (ref 6–20)
CO2: 22 mmol/L (ref 22–32)
Calcium: 8.9 mg/dL (ref 8.9–10.3)
Chloride: 106 mmol/L (ref 98–111)
Creatinine, Ser: 0.89 mg/dL (ref 0.44–1.00)
GFR calc Af Amer: >60 mL/min (ref >60)
GFR calc non Af Amer: >60 mL/min (ref >60)
Glucose, Bld: 104 mg/dL — ABNORMAL HIGH (ref 70–99)
Potassium: 3.4 mmol/L — ABNORMAL LOW (ref 3.5–5.1)
Sodium: 139 mmol/L (ref 135–145)

## 2019-09-24 LAB — HCG, QUANTITATIVE, PREGNANCY: hCG, Beta Chain, Quant, S: 8 m[IU]/mL — ABNORMAL HIGH (ref <5)

## 2019-09-24 LAB — SARS CORONAVIRUS 2 (TAT 6-24 HRS): SARS Coronavirus 2: NEGATIVE

## 2019-09-24 LAB — I-STAT BETA HCG BLOOD, ED (MC, WL, AP ONLY): I-stat hCG, quantitative: 5.3 m[IU]/mL — ABNORMAL HIGH (ref <5)

## 2019-09-24 MED ORDER — IOHEXOL 350 MG/ML SOLN
100.0000 mL | Freq: Once | INTRAVENOUS | Status: AC | PRN
  Administered 2019-09-24: 100 mL via INTRAVENOUS

## 2019-09-24 MED ORDER — ALBUTEROL SULFATE HFA 108 (90 BASE) MCG/ACT IN AERS
4.0000 | INHALATION_SPRAY | Freq: Once | RESPIRATORY_TRACT | Status: AC
  Administered 2019-09-24: 4 via RESPIRATORY_TRACT
  Filled 2019-09-24: qty 6.7

## 2019-09-24 MED ORDER — PREDNISONE 20 MG PO TABS: 40.0000 mg | ORAL_TABLET | Freq: Every day | ORAL | 0 refills | Status: AC

## 2019-09-24 MED ORDER — AMOXICILLIN-POT CLAVULANATE 875-125 MG PO TABS: 1.0000 | ORAL_TABLET | Freq: Two times a day (BID) | ORAL | 0 refills | Status: DC

## 2019-09-24 NOTE — ED Notes (Signed)
Ambulated pt in room, oxygen 99-100%. Pt denies sob at this time.

## 2019-09-24 NOTE — Discharge Instructions (Addendum)
Your work-up today has been reassuring, I suspect that your continued cough is likely related to reactive bronchitis, your CT scan showed resolution of pneumonia and no evidence of blood clots.  It does look like you have an ear infection on the left side, which is likely contributing to your pain and soreness in this area.  Please take antibiotics as directed, continue using albuterol and prescribed steroids to help with cough.  Follow-up with your PCP.  Return to the ED for any new or worsening symptoms.  You do have a Covid test pending you will be called with results in 18-24 hours if they are positive, you can also check online on MyChart.  If Covid test results are negative you can return to work.

## 2019-09-24 NOTE — ED Provider Notes (Signed)
[facility] EMERGENCY DEPARTMENT Provider Note   CSN: [id number] Arrival date & time: 09/23/19  2316     History   Chief Complaint Chief Complaint  Patient presents with  . SOB/Chest Tightness    HPI Cathy Payne is a 43 y.o. female.     Cathy Payne is a 43 y.o. female with a history of bipolar disorder, ADHD and anxiety, who presents to the emergency department for evaluation of shortness of breath, dry cough, chills and chest tightness.  She reports the symptoms started again 3 days ago.  Patient had recently been seen and treated outpatient for a bilateral pneumonia, had 3 negative Covid test during this course and completed a course of amoxicillin and azithromycin.  She was also treated with prednisone and albuterol.  She reports symptoms seem to improve, but 2 days after completing antibiotic she felt like symptoms began to come back.  She reports shortness of breath is intermittent, worse with cough.  She reports intermittent chest tightness.  Over the left upper chest, but no persistent chest pain.  She also reports that she has noticed a soreness over her left ear and neck.  No headache, no neck stiffness.  She has not had any measured fevers.  No abdominal pain, nausea, vomiting or diarrhea.  Patient does report that she works at [employer] and does work with [employer] tests.  No other aggravating or alleviating factors.     Past Medical History:  Diagnosis Date  . ADHD (attention deficit hyperactivity disorder)   . Anxiety   . Bipolar 1 disorder (HCC)     There are no active problems to display for this patient.   Past Surgical History:  Procedure Laterality Date  . CYST EXCISION    . DILATION AND CURETTAGE OF UTERUS  2001     OB History   No obstetric history on file.      Home Medications    Prior to Admission medications   Medication Sig Start Date End Date Taking? Authorizing Provider  amphetamine-dextroamphetamine (ADDERALL XR) 20 MG 24  hr capsule Take 20 mg by mouth every morning. 01/27/15   [provider]  buPROPion (WELLBUTRIN XL) 300 MG 24 hr tablet Take 300 mg by mouth daily. 01/29/15   [provider]  ibuprofen (ADVIL,MOTRIN) 800 MG tablet Take 1 tablet (800 mg total) by mouth every 8 (eight) hours as needed for mild pain or moderate pain. 04/14/15   Renford Dills, NP  ondansetron (ZOFRAN ODT) 4 MG disintegrating tablet Take 1 tablet (4 mg total) by mouth every 8 (eight) hours as needed for nausea or vomiting. 06/07/15   Minna Antis, MD  phenazopyridine (PYRIDIUM) 100 MG tablet Take 1 tablet (100 mg total) by mouth 3 (three) times daily as needed for pain. 06/07/15   Minna Antis, MD  risperiDONE (RISPERDAL) 1 MG tablet Take 1 mg by mouth daily. 01/29/15   [provider]  traMADol (ULTRAM) 50 MG tablet Take 1 tablet (50 mg total) by mouth every 8 (eight) hours as needed (Do not drive or operate machinery while taking as can cause drowsiness.). 04/14/15   Renford Dills, NP  zolpidem (AMBIEN CR) 12.5 MG CR tablet Take 12.5 mg by mouth at bedtime as needed. 12/30/14   [provider]    Family History Family History  Problem Relation Age of Onset  . Depression Mother   . Bipolar disorder Sister     Social History Social History   Tobacco Use  .  Smoking status: Current Every Day Smoker    Packs/day: 1.00    Types: Cigarettes  Substance Use Topics  . Alcohol use: No  . Drug use: No     Allergies   Patient has no known allergies.   Review of Systems Review of Systems  Constitutional: Positive for chills. Negative for fever.  HENT: Positive for ear pain.   Respiratory: Positive for cough, chest tightness and shortness of breath.   Cardiovascular: Negative for chest pain, palpitations and leg swelling.  Gastrointestinal: Negative for abdominal pain, diarrhea, nausea and vomiting.  Genitourinary: Negative for dysuria and frequency.  Musculoskeletal: Negative for  arthralgias and myalgias.  Skin: Negative for color change and rash.  Neurological: Negative for dizziness, syncope and light-headedness.  All other systems reviewed and are negative.    Physical Exam Updated Vital Signs BP (!) 151/116 (BP Location: Right Wrist)   Pulse (!) 111   Temp 98.1 F (36.7 C) (Oral)   Resp 20   SpO2 96%   Physical Exam Vitals signs and nursing note reviewed.  Constitutional:      General: She is not in acute distress.    Appearance: Normal appearance. She is well-developed and normal weight. She is not ill-appearing or diaphoretic.  HENT:     Head: Normocephalic and atraumatic.     Right Ear: Tympanic membrane and ear canal normal.     Left Ear: Tympanic membrane is erythematous and bulging.     Mouth/Throat:     Comments: Posterior oropharynx clear and mucous membranes moist, there is mild erythema but no edema or tonsillar exudates, uvula midline, normal phonation, no trismus, tolerating secretions without difficulty. Eyes:     General:        Right eye: No discharge.        Left eye: No discharge.     Pupils: Pupils are equal, round, and reactive to light.  Neck:     Musculoskeletal: Neck supple.  Cardiovascular:     Rate and Rhythm: Normal rate and regular rhythm.     Heart sounds: Normal heart sounds. No murmur. No friction rub. No gallop.   Pulmonary:     Effort: Pulmonary effort is normal. No respiratory distress.     Breath sounds: Normal breath sounds. No wheezing or rales.     Comments: Respirations equal and unlabored, patient able to speak in full sentences, lungs clear to auscultation bilaterally Abdominal:     General: Bowel sounds are normal. There is no distension.     Palpations: Abdomen is soft. There is no mass.     Tenderness: There is no abdominal tenderness. There is no guarding.     Comments: Abdomen soft, nondistended, nontender to palpation in all quadrants without guarding or peritoneal signs  Musculoskeletal:         General: No deformity.     Right lower leg: No edema.     Left lower leg: No edema.  Skin:    General: Skin is warm and dry.     Capillary Refill: Capillary refill takes less than 2 seconds.  Neurological:     Mental Status: She is alert.     Coordination: Coordination normal.     Comments: Speech is clear, able to follow commands Moves extremities without ataxia, coordination intact  Psychiatric:        Mood and Affect: Mood normal.        Behavior: Behavior normal.      ED Treatments / Results  Labs (all  labs ordered are listed, but only abnormal results are displayed) Labs Reviewed  CBC WITH DIFFERENTIAL/PLATELET - Abnormal; Notable for the following components:      Result Value   WBC 15.0 (*)    RBC 5.15 (*)    Hemoglobin 15.2 (*)    HCT 46.7 (*)    Neutro Abs 9.5 (*)    Lymphs Abs 4.4 (*)    All other components within normal limits  BASIC METABOLIC PANEL - Abnormal; Notable for the following components:   Potassium 3.4 (*)    Glucose, Bld 104 (*)    All other components within normal limits  HCG, QUANTITATIVE, PREGNANCY - Abnormal; Notable for the following components:   hCG, Beta Chain, Quant, S 8 (*)    All other components within normal limits  I-STAT BETA HCG BLOOD, ED ([facility], [facility], [facility] ONLY) - Abnormal; Notable for the following components:   I-stat hCG, quantitative 5.3 (*)    All other components within normal limits  SARS CORONAVIRUS 2 (TAT 6-24 HRS)    EKG EKG Interpretation  Date/Time:  Wednesday September 23 2019 23:29:58 EST Ventricular Rate:  107 PR Interval:  158 QRS Duration: 80 QT Interval:  328 QTC Calculation: 437 R Axis:   77 Text Interpretation: Sinus tachycardia Otherwise normal ECG Rate faster Confirmed by Ezequiel Essex [phone]) on 09/24/2019 8:00:35 AM   Radiology Dg Chest Portable 1 View  Result Date: 09/24/2019 CLINICAL DATA:  Shortness of breath, chills, cough EXAM: PORTABLE CHEST 1 VIEW COMPARISON:  None. FINDINGS: Heart and  mediastinal contours are within normal limits. No focal opacities or effusions. No acute bony abnormality. IMPRESSION: No active disease. Electronically Signed   By: Rolm Baptise M.D.   On: 09/24/2019 00:07    Procedures Procedures (including critical care time)  Medications Ordered in ED Medications  albuterol (VENTOLIN HFA) 108 (90 Base) MCG/ACT inhaler 4 puff (4 puffs Inhalation Given 09/24/19 0823)  iohexol (OMNIPAQUE) 350 MG/ML injection 100 mL (100 mLs Intravenous Contrast Given 09/24/19 0837)     Initial Impression / Assessment and Plan / ED Course  I have reviewed the triage vital signs and the nursing notes.  Pertinent labs & imaging results that were available during my care of the patient were reviewed by me and considered in my medical decision making (see chart for details).  43 yo F presents with SOB, dry cough and chills,these symptoms returned after completing a course abx for bilateral pneumonia, with negative covid tests. On arrival pt is mildly tachycardiac, vitals otherwise normal and pt is well appearing. CXR from triage shows resolution of pneumonia.  But lab work does show a leukocytosis. No significant electrolyte derangements.  Given returning symptoms no leukocytosis with now worsening shortness of breath we will plan to get CT angio of the chest to look for further pneumonia and also rule out PE.  We will also repeat Covid test.  Albuterol given.  There is evidence of left otitis media on exam which likely explains patient's ear and some neck soreness, so we will plan to treat that with antibiotics pending the rest of patient's work-up.  CT angio shows no evidence of pulmonary embolism and no evidence of persisting pneumonia.  This is very reassuring.  Patient ambulated in the department and maintained normal O2 sats.  Ordered Augmentin for otitis media as well as course of steroid for likely post infectious reactive bronchitis that is causing patient's persistent  shortness of breath and cough.  Will have patient continue using  albuterol.  PCP follow-up encouraged and strict return precautions discussed.  Patient expresses understanding and agreement.  He is aware she has an additional Covid test pending.  Discharged home in good condition.  Final Clinical Impressions(s) / ED Diagnoses   Final diagnoses:  Chest tightness  Cough  Acute otitis media, unspecified otitis media type    ED Discharge Orders    None       Legrand Rams 09/25/19 2309    Glynn Octave, MD 09/26/19 1013

## 2019-09-24 NOTE — ED Notes (Signed)
Pt discharge instructions and prescriptions reviewed with the patient. Pt verbalized understanding of discharge instructions. Pt discharged. 

## 2019-10-06 ENCOUNTER — Other Ambulatory Visit: Payer: Self-pay

## 2019-10-06 DIAGNOSIS — Z20822 Contact with and (suspected) exposure to covid-19: Secondary | ICD-10-CM

## 2019-10-08 LAB — NOVEL CORONAVIRUS, NAA: SARS-CoV-2, NAA: NOT DETECTED

## 2019-10-21 ENCOUNTER — Emergency Department (HOSPITAL_COMMUNITY)
Admission: EM | Admit: 2019-10-21 | Discharge: 2019-10-21 | Disposition: A | Discharge status: Home / Self Care | Payer: 59 | Attending: Emergency Medicine | Admitting: Emergency Medicine

## 2019-10-21 ENCOUNTER — Emergency Department (HOSPITAL_COMMUNITY): Payer: 59

## 2019-10-21 ENCOUNTER — Other Ambulatory Visit: Payer: Self-pay

## 2019-10-21 ENCOUNTER — Encounter (HOSPITAL_COMMUNITY): Payer: Self-pay | Admitting: Emergency Medicine

## 2019-10-21 DIAGNOSIS — R519 Headache, unspecified: Secondary | ICD-10-CM | POA: Insufficient documentation

## 2019-10-21 DIAGNOSIS — F909 Attention-deficit hyperactivity disorder, unspecified type: Secondary | ICD-10-CM | POA: Diagnosis not present

## 2019-10-21 DIAGNOSIS — F319 Bipolar disorder, unspecified: Secondary | ICD-10-CM | POA: Insufficient documentation

## 2019-10-21 DIAGNOSIS — R110 Nausea: Secondary | ICD-10-CM | POA: Diagnosis not present

## 2019-10-21 DIAGNOSIS — Z79899 Other long term (current) drug therapy: Secondary | ICD-10-CM | POA: Diagnosis not present

## 2019-10-21 DIAGNOSIS — R61 Generalized hyperhidrosis: Secondary | ICD-10-CM | POA: Insufficient documentation

## 2019-10-21 DIAGNOSIS — R42 Dizziness and giddiness: Secondary | ICD-10-CM | POA: Insufficient documentation

## 2019-10-21 DIAGNOSIS — F17210 Nicotine dependence, cigarettes, uncomplicated: Secondary | ICD-10-CM | POA: Diagnosis not present

## 2019-10-21 HISTORY — DX: Pneumonia, unspecified organism: J18.9

## 2019-10-21 LAB — CBC
HCT: 42.8 % (ref 36.0–46.0)
Hemoglobin: 14.2 g/dL (ref 12.0–15.0)
MCH: 29.8 pg (ref 26.0–34.0)
MCHC: 33.2 g/dL (ref 30.0–36.0)
MCV: 89.7 fL (ref 80.0–100.0)
Platelets: 304 10*3/uL (ref 150–400)
RBC: 4.77 MIL/uL (ref 3.87–5.11)
RDW: 12.5 % (ref 11.5–15.5)
WBC: 10.1 10*3/uL (ref 4.0–10.5)
nRBC: 0 % (ref 0.0–0.2)

## 2019-10-21 LAB — BASIC METABOLIC PANEL
Anion gap: 10 (ref 5–15)
BUN: 9 mg/dL (ref 6–20)
CO2: 25 mmol/L (ref 22–32)
Calcium: 9.6 mg/dL (ref 8.9–10.3)
Chloride: 104 mmol/L (ref 98–111)
Creatinine, Ser: 0.88 mg/dL (ref 0.44–1.00)
GFR calc Af Amer: >60 mL/min (ref >60)
GFR calc non Af Amer: >60 mL/min (ref >60)
Glucose, Bld: 131 mg/dL — ABNORMAL HIGH (ref 70–99)
Potassium: 3.8 mmol/L (ref 3.5–5.1)
Sodium: 139 mmol/L (ref 135–145)

## 2019-10-21 LAB — I-STAT BETA HCG BLOOD, ED (MC, WL, AP ONLY): I-stat hCG, quantitative: <5 m[IU]/mL (ref <5)

## 2019-10-21 LAB — TROPONIN I (HIGH SENSITIVITY)
Troponin I (High Sensitivity): <2 ng/L (ref <18)
Troponin I (High Sensitivity): <2 ng/L (ref <18)

## 2019-10-21 MED ORDER — ONDANSETRON HCL 4 MG/2ML IJ SOLN
4.0000 mg | Freq: Once | INTRAMUSCULAR | Status: AC
  Administered 2019-10-21: 4 mg via INTRAVENOUS
  Filled 2019-10-21: qty 2

## 2019-10-21 MED ORDER — SODIUM CHLORIDE 0.9% FLUSH: 3.0000 mL | Freq: Once | INTRAVENOUS | Status: DC

## 2019-10-21 MED ORDER — SODIUM CHLORIDE 0.9 % IV BOLUS
1000.0000 mL | Freq: Once | INTRAVENOUS | Status: AC
  Administered 2019-10-21: 1000 mL via INTRAVENOUS

## 2019-10-21 MED ORDER — IOHEXOL 350 MG/ML SOLN
75.0000 mL | Freq: Once | INTRAVENOUS | Status: AC | PRN
  Administered 2019-10-21: 75 mL via INTRAVENOUS

## 2019-10-21 MED ORDER — ONDANSETRON 4 MG PO TBDP: 4.0000 mg | ORAL_TABLET | Freq: Three times a day (TID) | ORAL | 0 refills | Status: DC | PRN

## 2019-10-21 NOTE — ED Notes (Signed)
Patient transported to CT 

## 2019-10-21 NOTE — ED Triage Notes (Signed)
After eating lunch pt had onset of dizziness, nausea, and diaphoresis.  Denies pain or SOB.  States she was exposed to her COVID + sister 18 days ago and was quarantined until Saturday.  Diagnosed with PNA approx 1 month ago.

## 2019-10-21 NOTE — ED Notes (Signed)
Pt verbalized understanding of d/c instructions and followup care and s/s requiring return to ed. Pt had no further questions at this time

## 2019-10-21 NOTE — ED Provider Notes (Signed)
[facility] EMERGENCY DEPARTMENT Provider Note   CSN: [id number] Arrival date & time: 10/21/19  1653     History   Chief Complaint Chief Complaint  Patient presents with   Dizziness   Nausea    HPI Cathy Payne is a 43 y.o. female with past medical history significant for anxiety, bipolar disorder who presents for evaluation of dizziness. Patient states after eating lunch at work she felt sudden onset dizziness described as the room spinning. States she developed sudden nausea without emesis and diaphoresis. States she did had a mild HA today however denies sudden onset thunderclap HA. HA resolved with Tylenol. She recently completed course of ABX for CAP. Denies CP, SOB, hemoptysis, Abd pain, dysuria, diarrhea, facial droop, weakness, dysphagia, neck pain, neck stiffness. Has not taken anything for her symptoms. States she has mild nausea currently however otherwise feels "fine." Denies additional aggravating or alleviating factors. No current dizziness.  Hx of inner ear infection 3 weeks prior. Denies pain to ear, posterior ear, discharge.  History obtained from patient and past medical records. No interpretor was used.     HPI  Past Medical History:  Diagnosis Date   ADHD (attention deficit hyperactivity disorder)    Anxiety    Bipolar 1 disorder (HCC)    Pneumonia     There are no active problems to display for this patient.   Past Surgical History:  Procedure Laterality Date   CYST EXCISION     DILATION AND CURETTAGE OF UTERUS  2001     OB History   No obstetric history on file.      Home Medications    Prior to Admission medications   Medication Sig Start Date End Date Taking? Authorizing Provider  amoxicillin-clavulanate (AUGMENTIN) 875-125 MG tablet Take 1 tablet by mouth 2 (two) times daily. One po bid x 7 days 09/24/19   Jacqlyn Larsen, PA-C  amphetamine-dextroamphetamine (ADDERALL XR) 20 MG 24 hr capsule Take 20 mg by mouth  every morning. 01/27/15   [provider]  buPROPion (WELLBUTRIN XL) 300 MG 24 hr tablet Take 300 mg by mouth daily. 01/29/15   [provider]  ibuprofen (ADVIL,MOTRIN) 800 MG tablet Take 1 tablet (800 mg total) by mouth every 8 (eight) hours as needed for mild pain or moderate pain. 04/14/15   Marylene Land, NP  ondansetron (ZOFRAN ODT) 4 MG disintegrating tablet Take 1 tablet (4 mg total) by mouth every 8 (eight) hours as needed for nausea or vomiting. 10/21/19   Rue Valladares A, PA-C  phenazopyridine (PYRIDIUM) 100 MG tablet Take 1 tablet (100 mg total) by mouth 3 (three) times daily as needed for pain. 06/07/15   Harvest Dark, MD  risperiDONE (RISPERDAL) 1 MG tablet Take 1 mg by mouth daily. 01/29/15   [provider]  traMADol (ULTRAM) 50 MG tablet Take 1 tablet (50 mg total) by mouth every 8 (eight) hours as needed (Do not drive or operate machinery while taking as can cause drowsiness.). 04/14/15   Marylene Land, NP  zolpidem (AMBIEN CR) 12.5 MG CR tablet Take 12.5 mg by mouth at bedtime as needed. 12/30/14   [provider]    Family History Family History  Problem Relation Age of Onset   Depression Mother    Bipolar disorder Sister     Social History Social History   Tobacco Use   Smoking status: Current Every Day Smoker    Packs/day: 1.00    Types: Cigarettes   Smokeless  tobacco: Never Used  Substance Use Topics   Alcohol use: No   Drug use: No     Allergies   Patient has no known allergies.   Review of Systems Review of Systems  Constitutional: Positive for diaphoresis. Negative for activity change, appetite change, chills, fatigue and fever.  HENT: Negative.   Respiratory: Negative.   Cardiovascular: Negative.   Gastrointestinal: Negative.   Genitourinary: Negative.   Musculoskeletal: Negative.   Skin: Negative.   Neurological: Positive for dizziness and headaches (Earlier, non currently).  All other systems  reviewed and are negative.    Physical Exam Updated Vital Signs BP 125/71    Pulse 80    Temp 98.1 F (36.7 C) (Oral)    Resp (!) 25    Ht  (1.727 m)    Wt 90.7 kg    SpO2 99%    BMI 30.40 kg/m   Physical Exam Vitals signs and nursing note reviewed.  Constitutional:      General: She is not in acute distress.    Appearance: She is well-developed. She is not ill-appearing, toxic-appearing or diaphoretic.  HENT:     Head: Normocephalic and atraumatic.     Comments: No mastoid tenderness    Right Ear: Tympanic membrane, ear canal and external ear normal. No drainage, swelling or tenderness. No middle ear effusion. No mastoid tenderness. Tympanic membrane is not scarred, perforated, erythematous, retracted or bulging.     Left Ear: Tympanic membrane, ear canal and external ear normal. No drainage, swelling or tenderness.  No middle ear effusion. No mastoid tenderness. Tympanic membrane is not scarred, perforated, erythematous, retracted or bulging.     Mouth/Throat:     Mouth: Mucous membranes are moist.     Pharynx: Oropharynx is clear.  Eyes:     Pupils: Pupils are equal, round, and reactive to light.     Comments: No horizontal, vertical or rotational nystagmus   Neck:     Musculoskeletal: Normal range of motion.     Comments: Full active and passive ROM without pain No midline or paraspinal tenderness No nuchal rigidity or meningeal signs  Cardiovascular:     Rate and Rhythm: Normal rate.     Pulses: Normal pulses.          Dorsalis pedis pulses are 2+ on the right side and 2+ on the left side.       Posterior tibial pulses are 2+ on the right side and 2+ on the left side.     Heart sounds: Normal heart sounds.  Pulmonary:     Effort: Pulmonary effort is normal. No respiratory distress.     Breath sounds: Normal breath sounds.  Abdominal:     General: Bowel sounds are normal. There is no distension.  Musculoskeletal: Normal range of motion.     Comments: No evidence of  DVT on exam. Homans negative. Moves all 4 extremities without difficulty.  Skin:    General: Skin is warm and dry.     Capillary Refill: Capillary refill takes less than 2 seconds.     Comments: Brisk cap refill. No rashes or lesions.  Neurological:     Mental Status: She is alert.     Comments: Mental Status:  Alert, oriented, thought content appropriate. Speech fluent without evidence of aphasia. Able to follow 2 step commands without difficulty.  Cranial Nerves:  II:  Peripheral visual fields grossly normal, pupils equal, round, reactive to light III,IV, VI: ptosis not present, extra-ocular motions intact  bilaterally  V,VII: smile symmetric, facial light touch sensation equal VIII: hearing grossly normal bilaterally  IX,X: midline uvula rise  XI: bilateral shoulder shrug equal and strong XII: midline tongue extension  Motor:  5/5 in upper and lower extremities bilaterally including strong and equal grip strength and dorsiflexion/plantar flexion Sensory: Pinprick and light touch normal in all extremities.  Deep Tendon Reflexes: 2+ and symmetric  Cerebellar: normal finger-to-nose with bilateral upper extremities Gait: normal gait and balance CV: distal pulses palpable throughout       ED Treatments / Results  Labs (all labs ordered are listed, but only abnormal results are displayed) Labs Reviewed  BASIC METABOLIC PANEL - Abnormal; Notable for the following components:      Result Value   Glucose, Bld 131 (*)    All other components within normal limits  CBC  I-STAT BETA HCG BLOOD, ED ([facility], [facility], [facility] ONLY)  TROPONIN I (HIGH SENSITIVITY)  TROPONIN I (HIGH SENSITIVITY)    EKG EKG Interpretation  Date/Time:  Wednesday October 21 2019 17:02:05 EST Ventricular Rate:  94 PR Interval:  156 QRS Duration: 84 QT Interval:  386 QTC Calculation: 482 R Axis:   68 Text Interpretation: Normal sinus rhythm Prolonged QT Abnormal ECG Confirmed by Benjiman Core [phone]) on 10/21/2019  7:20:21 PM   Radiology Ct Angio Head W Or Wo Contrast  Result Date: 10/21/2019 CLINICAL DATA:  43 year old female with acute onset dizziness nausea and diaphoresis after lunch. Recent COVID-19 exposure. EXAM: CT ANGIOGRAPHY HEAD AND NECK TECHNIQUE: Multidetector CT imaging of the head and neck was performed using the standard protocol during bolus administration of intravenous contrast. Multiplanar CT image reconstructions and MIPs were obtained to evaluate the vascular anatomy. Carotid stenosis measurements (when applicable) are obtained utilizing NASCET criteria, using the distal internal carotid diameter as the denominator. CONTRAST:  75 milliliters Omnipaque 350 COMPARISON:  Chest CTA 09/24/2019. FINDINGS: CT HEAD Brain: Normal cerebral volume. No midline shift, ventriculomegaly, mass effect, evidence of mass lesion, intracranial hemorrhage or evidence of cortically based acute infarction. Gray-white matter differentiation is within normal limits throughout the brain. Calvarium and skull base: Negative. Paranasal sinuses: Visualized paranasal sinuses and mastoids are clear. Tympanic cavities are clear. Orbits: Visualized orbits and scalp soft tissues are within normal limits. CTA NECK Skeleton: Negative. Upper chest: Negative; minimal dependent atelectasis. Other neck: The glottis is closed. Partially retropharyngeal course of the right ICA, normal variant. Mild asymmetry of the sublingual glands appears within normal limits. No acute findings. Aortic arch: 3 vessel arch configuration with no arch atherosclerosis. Right carotid system: Negative. Left carotid system: Negative. Vertebral arteries: Normal proximal right subclavian artery and right vertebral artery origin. The right vertebral is patent and appears normal to the skull base. Proximal left subclavian artery and left vertebral artery origin appear normal. Left vertebral artery is patent to the skull base and appears normal. CTA HEAD Posterior  circulation: Fairly codominant distal vertebral arteries without atherosclerosis or stenosis. Patent PICA origins and vertebrobasilar junction. Patent basilar artery. Patent SCA and PCA origins. Posterior communicating arteries are diminutive or absent. Bilateral PCA branches are within normal limits. Anterior circulation: Both ICA siphons are patent without atherosclerosis or stenosis. Normal carotid termini, MCA and ACA origins. Anterior communicating artery and bilateral ACA branches are within normal limits. Left MCA M1 segment, bifurcation and left MCA branches are within normal limits. Right MCA M1 segment, bifurcation and right MCA branches are within normal limits. Venous sinuses: Patent. Mild cavernous sinus venous contamination. Anatomic variants: None. Review  of the MIP images confirms the above findings IMPRESSION: 1. Normal CTA Head and Neck. 2. Normal CT appearance of the brain. 3. Negative visible upper chest. Electronically Signed   By: Odessa Fleming M.D.   On: 10/21/2019 21:19   Ct Angio Neck W And/or Wo Contrast  Result Date: 10/21/2019 CLINICAL DATA:  43 year old female with acute onset dizziness nausea and diaphoresis after lunch. Recent COVID-19 exposure. EXAM: CT ANGIOGRAPHY HEAD AND NECK TECHNIQUE: Multidetector CT imaging of the head and neck was performed using the standard protocol during bolus administration of intravenous contrast. Multiplanar CT image reconstructions and MIPs were obtained to evaluate the vascular anatomy. Carotid stenosis measurements (when applicable) are obtained utilizing NASCET criteria, using the distal internal carotid diameter as the denominator. CONTRAST:  75 milliliters Omnipaque 350 COMPARISON:  Chest CTA 09/24/2019. FINDINGS: CT HEAD Brain: Normal cerebral volume. No midline shift, ventriculomegaly, mass effect, evidence of mass lesion, intracranial hemorrhage or evidence of cortically based acute infarction. Gray-white matter differentiation is within normal  limits throughout the brain. Calvarium and skull base: Negative. Paranasal sinuses: Visualized paranasal sinuses and mastoids are clear. Tympanic cavities are clear. Orbits: Visualized orbits and scalp soft tissues are within normal limits. CTA NECK Skeleton: Negative. Upper chest: Negative; minimal dependent atelectasis. Other neck: The glottis is closed. Partially retropharyngeal course of the right ICA, normal variant. Mild asymmetry of the sublingual glands appears within normal limits. No acute findings. Aortic arch: 3 vessel arch configuration with no arch atherosclerosis. Right carotid system: Negative. Left carotid system: Negative. Vertebral arteries: Normal proximal right subclavian artery and right vertebral artery origin. The right vertebral is patent and appears normal to the skull base. Proximal left subclavian artery and left vertebral artery origin appear normal. Left vertebral artery is patent to the skull base and appears normal. CTA HEAD Posterior circulation: Fairly codominant distal vertebral arteries without atherosclerosis or stenosis. Patent PICA origins and vertebrobasilar junction. Patent basilar artery. Patent SCA and PCA origins. Posterior communicating arteries are diminutive or absent. Bilateral PCA branches are within normal limits. Anterior circulation: Both ICA siphons are patent without atherosclerosis or stenosis. Normal carotid termini, MCA and ACA origins. Anterior communicating artery and bilateral ACA branches are within normal limits. Left MCA M1 segment, bifurcation and left MCA branches are within normal limits. Right MCA M1 segment, bifurcation and right MCA branches are within normal limits. Venous sinuses: Patent. Mild cavernous sinus venous contamination. Anatomic variants: None. Review of the MIP images confirms the above findings IMPRESSION: 1. Normal CTA Head and Neck. 2. Normal CT appearance of the brain. 3. Negative visible upper chest. Electronically Signed   By: Odessa Fleming M.D.   On: 10/21/2019 21:19   Dg Chest Portable 1 View  Result Date: 10/21/2019 CLINICAL DATA:  Diaphoresis. EXAM: PORTABLE CHEST 1 VIEW COMPARISON:  September 23, 2019. FINDINGS: The heart size and mediastinal contours are within normal limits. Both lungs are clear. The visualized skeletal structures are unremarkable. IMPRESSION: No active disease. Electronically Signed   By: Lupita Raider M.D.   On: 10/21/2019 18:33    Procedures Procedures (including critical care time)  Medications Ordered in ED Medications  sodium chloride flush (NS) 0.9 % injection 3 mL (3 mLs Intravenous Not Given 10/21/19 1946)  ondansetron (ZOFRAN) injection 4 mg (4 mg Intravenous Given 10/21/19 1941)  sodium chloride 0.9 % bolus 1,000 mL (1,000 mLs Intravenous New Bag/Given 10/21/19 1941)  iohexol (OMNIPAQUE) 350 MG/ML injection 75 mL (75 mLs Intravenous Contrast Given 10/21/19 2052)  Initial Impression / Assessment and Plan / ED Course  I have reviewed the triage vital signs and the nursing notes.  Pertinent labs & imaging results that were available during my care of the patient were reviewed by me and considered in my medical decision making (see chart for details).  43 year old presents for evaluation of dizziness. Afebrile, non septic, non ill appearing. Dizziness described as room spinning. Last <10 minutes, Denies sudden onset thunderclap HA however did had HA earlier today. No unilateral weakness or paresthesias. Non focal neuro exam. Normal MSK exam. No CP, SOB, hemoptysis. No prior PE, ACS. No DVT on exam. No tachycardia, tachypnea or hypoxia. PERC negative. Wells criteria low risk. Low suspicion for PE. Heart and lungs clear. Abd soft non tender without rebound or guarding. Remote hx of otitis however no evidence of mastoiditis on exam and TM clear bilaterally. No evidence of meningitis on exam.  Labs and imaging personally reviewed:  Clinical Course as of Oct 21 2147  Wed Oct 21, 2019  1910 No  leukocytosis, Hgb stable at 14.2  CBC [BH]  1911 Mild elevation in glucose at 131, no additional electrolyte, renal or liver abnormalities.  Basic metabolic panel(!) [BH]  1911 negative  I-Stat beta hCG blood, ED [BH]  1911 No infiltrates, cardiomegaly, pulmonary edema,pneumothorax  DG Chest Portable 1 View [BH]    Clinical Course User Index [BH] Antwuan Eckley A, PA-C   2130: Patient reevaluated. Denies sx. Tolerating PO intake without difficulty.  Patient with normal neurologic exam.  No vertical or rotational nystagmus. Patient normal finger-nose and normal gait.  No slurred speech renal or weakness.  Doubt CVA or other central cause of vertigo.  Patient instructed to followup with her primary care physician within 3 days for further evaluation. They are to return to the emergency department for new neurologic symptoms, loss of vision or other concerning symptoms. Sx is not likely of cardiac or pulmonary etiology d/t presentation, PERC negative, VSS, no tracheal deviation, no JVD or new murmur, RRR, breath sounds equal bilaterally, EKG without acute abnormalities, negative troponin, and negative CXR.   The patient has been appropriately medically screened and/or stabilized in the ED. I have low suspicion for any other emergent medical condition which would require further screening, evaluation or treatment in the ED or require inpatient management.   Final Clinical Impressions(s) / ED Diagnoses   Final diagnoses:  Dizziness  Nausea    ED Discharge Orders         Ordered    ondansetron (ZOFRAN ODT) 4 MG disintegrating tablet  Every 8 hours PRN     10/21/19 2144           Cassundra Mckeever A, PA-C 10/21/19 2148    Benjiman Core, MD 10/21/19 2230

## 2019-10-21 NOTE — Discharge Instructions (Signed)
Take the zofran as prescribed. Return for new or worsening symptoms

## 2019-12-31 ENCOUNTER — Other Ambulatory Visit: Payer: Self-pay

## 2019-12-31 ENCOUNTER — Encounter: Payer: Self-pay | Admitting: Emergency Medicine

## 2019-12-31 ENCOUNTER — Ambulatory Visit
Admission: EM | Admit: 2019-12-31 | Discharge: 2019-12-31 | Disposition: A | Discharge status: Home / Self Care | Payer: 59 | Attending: Emergency Medicine | Admitting: Emergency Medicine

## 2019-12-31 DIAGNOSIS — J029 Acute pharyngitis, unspecified: Secondary | ICD-10-CM | POA: Diagnosis not present

## 2019-12-31 DIAGNOSIS — H6981 Other specified disorders of Eustachian tube, right ear: Secondary | ICD-10-CM

## 2019-12-31 DIAGNOSIS — H6692 Otitis media, unspecified, left ear: Secondary | ICD-10-CM | POA: Diagnosis not present

## 2019-12-31 MED ORDER — AMOXICILLIN 875 MG PO TABS: 875.0000 mg | ORAL_TABLET | Freq: Two times a day (BID) | ORAL | 0 refills | Status: AC

## 2019-12-31 MED ORDER — MUCINEX 600 MG PO TB12: 1200.0000 mg | ORAL_TABLET | Freq: Two times a day (BID) | ORAL | 0 refills | Status: DC | PRN

## 2019-12-31 NOTE — ED Provider Notes (Signed)
Renaldo Fiddler    CSN: [id number] Arrival date & time: 12/31/19  1023      History   Chief Complaint Chief Complaint  Patient presents with  . Sore Throat  . Otalgia    left    HPI Cathy Payne is a 44 y.o. female.   Patient presents with left ear pain x 2-3 days.  She also reports a sore throat since yesterday.  She denies fever, chills, rash, difficulty swallowing, shortness of breath, vomiting, diarrhea, or other symptoms.  Treatment attempted at home with Tylenol.  Patient was seen at a different urgent care yesterday evening and had a negative flu, strep, COVID test.    The history is provided by the patient.    Past Medical History:  Diagnosis Date  . ADHD (attention deficit hyperactivity disorder)   . Anxiety   . Bipolar 1 disorder (HCC)   . Pneumonia     There are no problems to display for this patient.   Past Surgical History:  Procedure Laterality Date  . CYST EXCISION    . DILATION AND CURETTAGE OF UTERUS  2001    OB History   No obstetric history on file.      Home Medications    Prior to Admission medications   Medication Sig Start Date End Date Taking? Authorizing Provider  acetaminophen (TYLENOL) 650 MG CR tablet Take 650 mg by mouth every 8 (eight) hours as needed for pain.   Yes [provider]  amoxicillin (AMOXIL) 875 MG tablet Take 1 tablet (875 mg total) by mouth 2 (two) times daily for 10 days. 12/31/19 01/10/20  Mickie Bail, NP  amoxicillin-clavulanate (AUGMENTIN) 875-125 MG tablet Take 1 tablet by mouth 2 (two) times daily. One po bid x 7 days 09/24/19   Dartha Lodge, PA-C  amphetamine-dextroamphetamine (ADDERALL XR) 20 MG 24 hr capsule Take 20 mg by mouth every morning. 01/27/15   [provider]  buPROPion (WELLBUTRIN XL) 300 MG 24 hr tablet Take 300 mg by mouth daily. 01/29/15   [provider]  guaiFENesin (MUCINEX) 600 MG 12 hr tablet Take 2 tablets (1,200 mg total) by mouth 2 (two) times daily as  needed. 12/31/19   Mickie Bail, NP  ibuprofen (ADVIL,MOTRIN) 800 MG tablet Take 1 tablet (800 mg total) by mouth every 8 (eight) hours as needed for mild pain or moderate pain. 04/14/15   Renford Dills, NP  ondansetron (ZOFRAN ODT) 4 MG disintegrating tablet Take 1 tablet (4 mg total) by mouth every 8 (eight) hours as needed for nausea or vomiting. 10/21/19   Henderly, Britni A, PA-C  phenazopyridine (PYRIDIUM) 100 MG tablet Take 1 tablet (100 mg total) by mouth 3 (three) times daily as needed for pain. 06/07/15   Minna Antis, MD  risperiDONE (RISPERDAL) 1 MG tablet Take 1 mg by mouth daily. 01/29/15   [provider]  traMADol (ULTRAM) 50 MG tablet Take 1 tablet (50 mg total) by mouth every 8 (eight) hours as needed (Do not drive or operate machinery while taking as can cause drowsiness.). 04/14/15   Renford Dills, NP  zolpidem (AMBIEN CR) 12.5 MG CR tablet Take 12.5 mg by mouth at bedtime as needed. 12/30/14   [provider]    Family History Family History  Problem Relation Age of Onset  . Depression Mother   . Bipolar disorder Sister     Social History Social History   Tobacco Use  . Smoking status: Current Every Day  Smoker    Packs/day: 1.00    Types: Cigarettes  . Smokeless tobacco: Never Used  Substance Use Topics  . Alcohol use: Yes    Comment: occ  . Drug use: No     Allergies   Patient has no known allergies.   Review of Systems Review of Systems  Constitutional: Negative for chills and fever.  HENT: Positive for ear pain and sore throat. Negative for congestion, rhinorrhea and trouble swallowing.   Eyes: Negative for pain and visual disturbance.  Respiratory: Negative for cough and shortness of breath.   Cardiovascular: Negative for chest pain and palpitations.  Gastrointestinal: Negative for abdominal pain, diarrhea, nausea and vomiting.  Genitourinary: Negative for dysuria and hematuria.  Musculoskeletal: Negative for arthralgias and  back pain.  Skin: Negative for color change and rash.  Neurological: Negative for seizures and syncope.  All other systems reviewed and are negative.    Physical Exam Triage Vital Signs ED Triage Vitals  Enc Vitals Group     BP      Pulse      Resp      Temp      Temp src      SpO2      Weight      Height      Head Circumference      Peak Flow      Pain Score      Pain Loc      Pain Edu?      Excl. in [place]?    No data found.  Updated Vital Signs BP 140/83 (BP Location: Left Arm)   Pulse 94   Temp 99 F (37.2 C) (Oral)   Resp 18   Ht 5' 8.5" (1.74 m)   Wt 235 lb (106.6 kg)   SpO2 96%   BMI 35.21 kg/m   Visual Acuity Right Eye Distance:   Left Eye Distance:   Bilateral Distance:    Right Eye Near:   Left Eye Near:    Bilateral Near:     Physical Exam Vitals and nursing note reviewed.  Constitutional:      General: She is not in acute distress.    Appearance: She is well-developed. She is not ill-appearing.  HENT:     Head: Normocephalic and atraumatic.     Right Ear: A middle ear effusion is present.     Left Ear: Tympanic membrane is erythematous.     Nose: Nose normal.     Mouth/Throat:     Mouth: Mucous membranes are moist.     Pharynx: Oropharynx is clear.  Eyes:     Conjunctiva/sclera: Conjunctivae normal.  Cardiovascular:     Rate and Rhythm: Normal rate and regular rhythm.     Heart sounds: No murmur.  Pulmonary:     Effort: Pulmonary effort is normal. No respiratory distress.     Breath sounds: Normal breath sounds.  Abdominal:     General: Bowel sounds are normal.     Palpations: Abdomen is soft.     Tenderness: There is no abdominal tenderness. There is no guarding or rebound.  Musculoskeletal:     Cervical back: Neck supple.  Skin:    General: Skin is warm and dry.     Findings: No rash.  Neurological:     General: No focal deficit present.     Mental Status: She is alert and oriented to person, place, and time.  Psychiatric:         Mood  and Affect: Mood normal.        Behavior: Behavior normal.      UC Treatments / Results  Labs (all labs ordered are listed, but only abnormal results are displayed) Labs Reviewed - No data to display  EKG   Radiology No results found.  Procedures Procedures (including critical care time)  Medications Ordered in UC Medications - No data to display  Initial Impression / Assessment and Plan / UC Course  I have reviewed the triage vital signs and the nursing notes.  Pertinent labs & imaging results that were available during my care of the patient were reviewed by me and considered in my medical decision making (see chart for details).    Left otitis media, sore throat, right eustachian tube dysfunction.  Treating with amoxicillin and Mucinex; Tylenol as needed for discomfort.  Instructed patient to follow-up with her PCP if her symptoms are not improving.  Patient agrees to plan of care.     Final Clinical Impressions(s) / UC Diagnoses   Final diagnoses:  Left otitis media, unspecified otitis media type  Sore throat  Dysfunction of right eustachian tube     Discharge Instructions     Take the amoxicillin and Mucinex as directed.    Follow up with your primary care provider if your symptoms are not improving.      ED Prescriptions    Medication Sig Dispense Auth. Provider   amoxicillin (AMOXIL) 875 MG tablet Take 1 tablet (875 mg total) by mouth 2 (two) times daily for 10 days. 20 tablet Sharion Balloon, NP   guaiFENesin (MUCINEX) 600 MG 12 hr tablet Take 2 tablets (1,200 mg total) by mouth 2 (two) times daily as needed. 12 tablet Sharion Balloon, NP     PDMP not reviewed this encounter.   Sharion Balloon, NP 12/31/19 1057

## 2019-12-31 NOTE — ED Triage Notes (Signed)
Pt c/o sore throat and left ear pain. Started yesterday. She states that she has white patches on the back of he throat. Denies fever. She states that she went to another urgent care last night and was tested for rapid covid and rapid strep and both were negative, flu was also done but she never got the results.

## 2019-12-31 NOTE — Discharge Instructions (Signed)
Take the amoxicillin and Mucinex as directed.  Follow up with your primary care provider if your symptoms are not improving.    

## 2020-01-03 IMAGING — CT CT ANGIO CHEST
2 of 6 series · 19 of 36 positions shown · IV contrast (omnipaque)
Comparison: Radiograph 09/24/2019

CLINICAL DATA: Short of breath. Recent pneumonia. Tachycardia.
Concern for PE

EXAM:
CT ANGIOGRAPHY CHEST WITH CONTRAST
TECHNIQUE: Multidetector CT imaging of the chest was performed using the
standard protocol during bolus administration of intravenous
contrast. Multiplanar CT image reconstructions and MIPs were
obtained to evaluate the vascular anatomy.
CONTRAST:  100mL OMNIPAQUE IOHEXOL 350 MG/ML SOLN

[Series 7: pe thins · axial · 0.80mm/px · z∈[-458,-243]mm · 18 of 342 slices shown]
[im 18/342  lung]
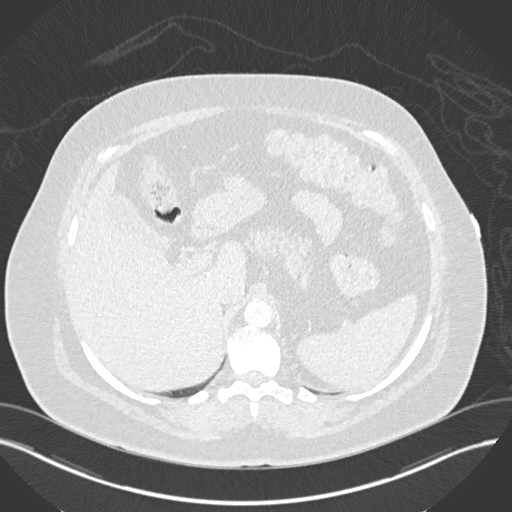
[im 35/342  mediastinal]
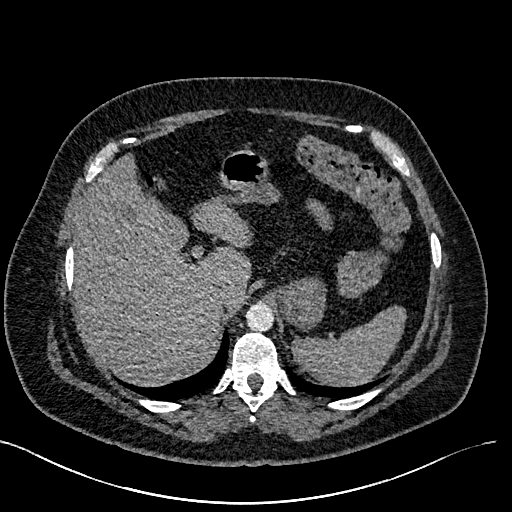
[im 52/342  lung]
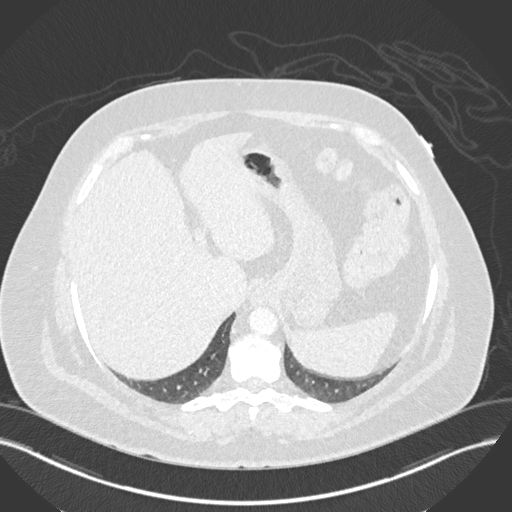
[im 69/342  mediastinal]
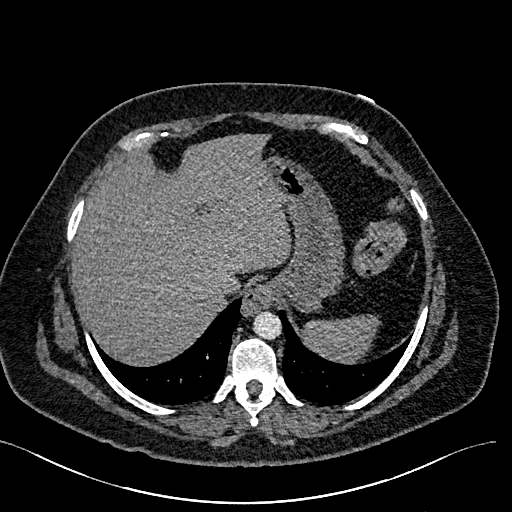
[im 86/342  lung]
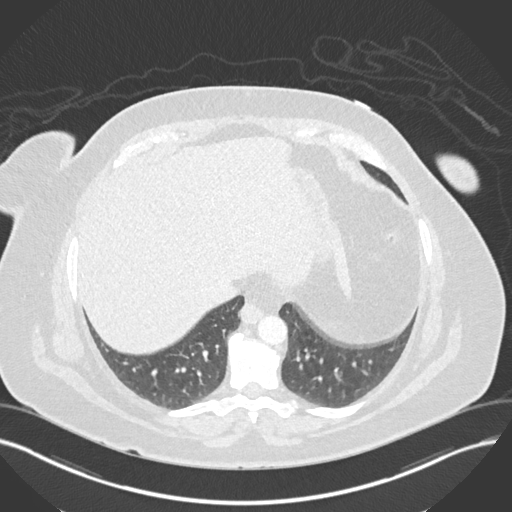
[im 103/342  mediastinal]
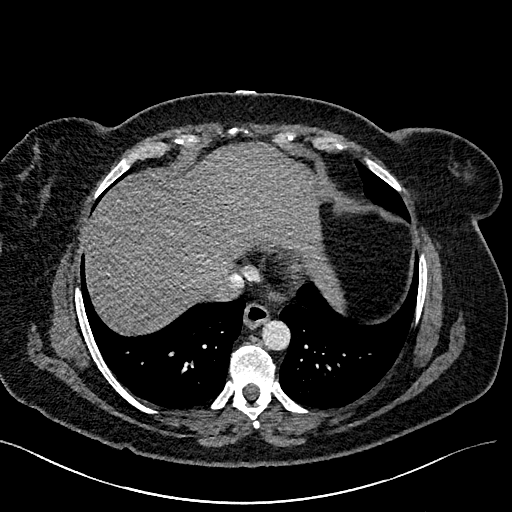
[im 120/342  lung]
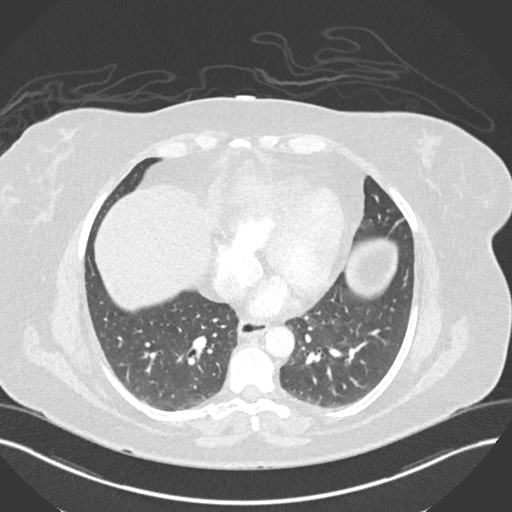
[im 137/342  mediastinal]
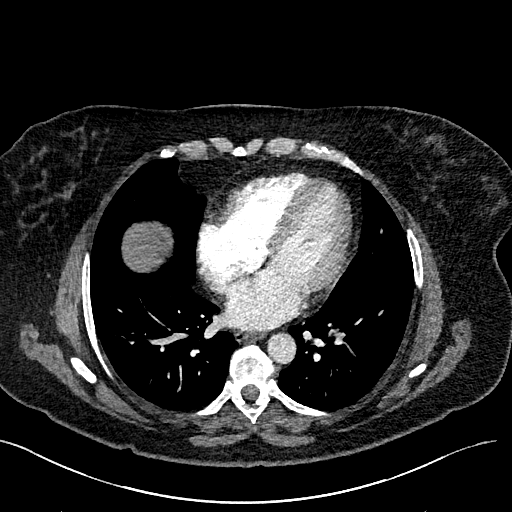
[im 154/342  lung]
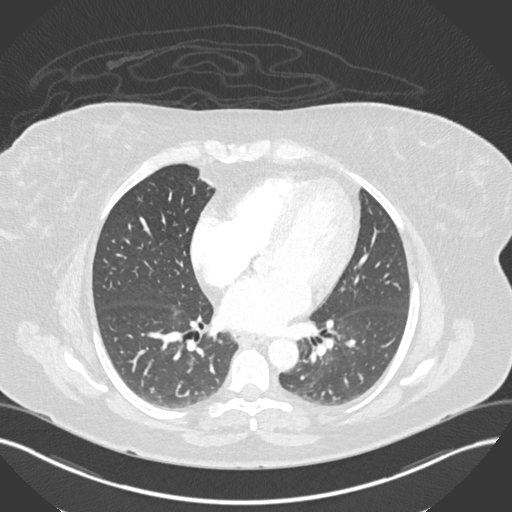
[im 188/342  mediastinal]
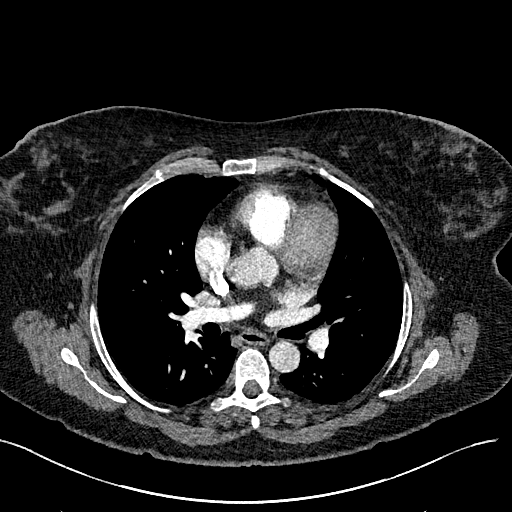
[im 205/342  lung]
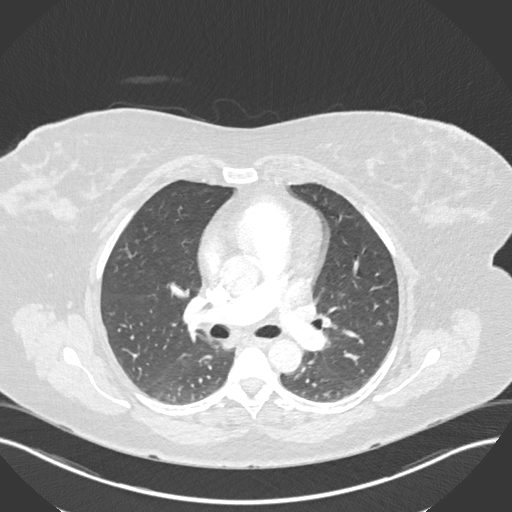
[im 222/342  mediastinal]
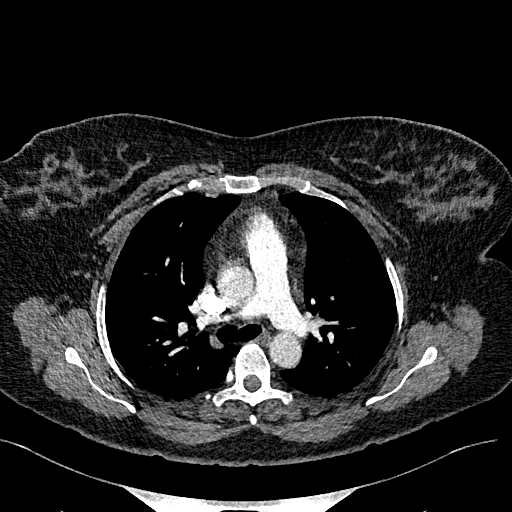
[im 239/342  lung]
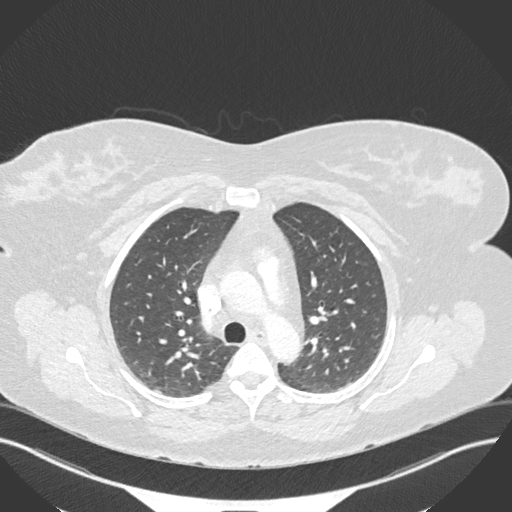
[im 256/342  mediastinal]
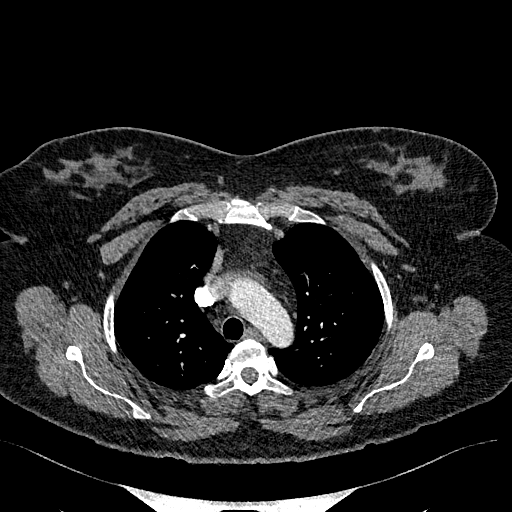
[im 273/342  lung]
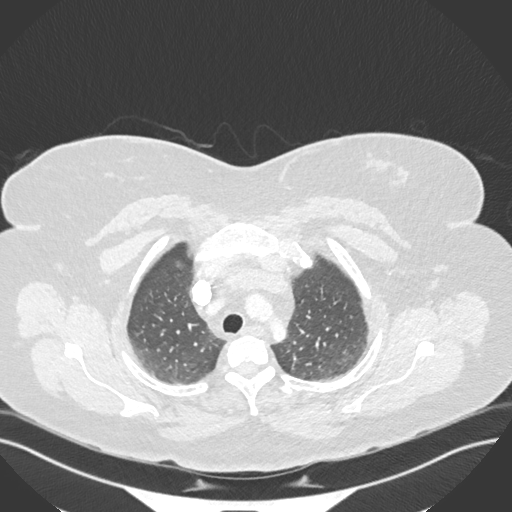
[im 290/342  mediastinal]
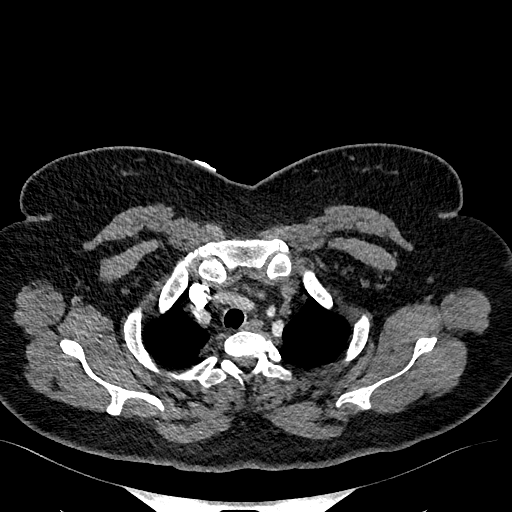
[im 307/342  lung]
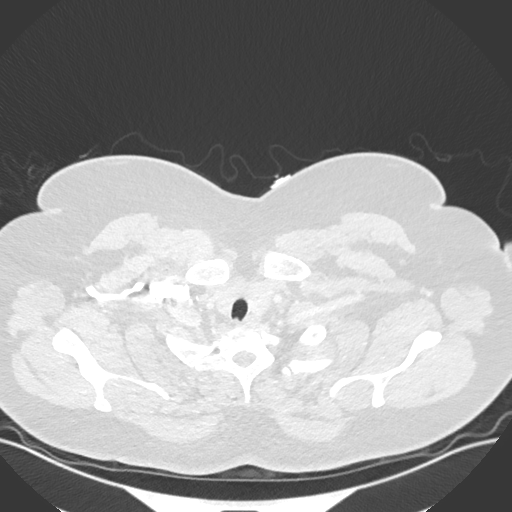
[im 324/342  mediastinal]
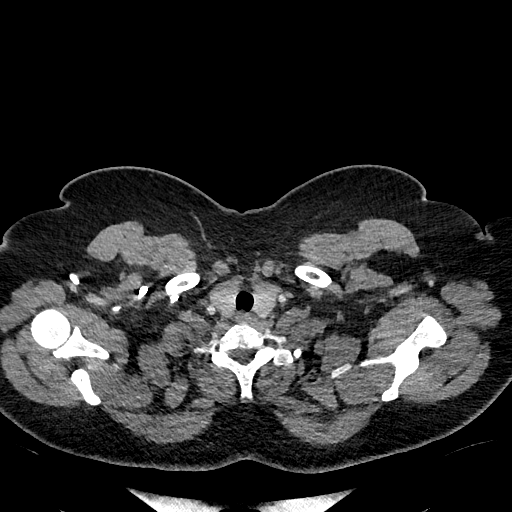

[Series 8: pe 2mm cor · coronal · 0.47mm/px · 1 of 146 slices shown]
[im 73/146  mediastinal]
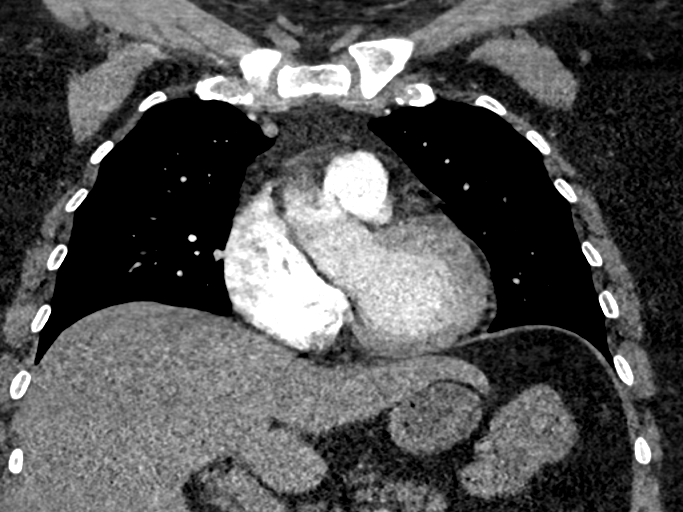

[19 of 36 positions shown; findings below may reference images not displayed]

FINDINGS: Cardiovascular: No filling defects within the pulmonary arteries to
suggest acute pulmonary embolism. No acute findings of the aorta or
great vessels. No pericardial fluid.

Mediastinum/Nodes: No axillary supraclavicular adenopathy. No
mediastinal hilar adenopathy. No pericardial effusion. Esophagus
normal.

Lungs/Pleura: No pulmonary infarction. No airspace disease. No
pneumonia. Airways normal.

Upper Abdomen: Limited view of the liver, kidneys, pancreas are
unremarkable. Normal adrenal glands.

Musculoskeletal: No aggressive osseous lesion.

Review of the MIP images confirms the above findings.
IMPRESSION: 1. No evidence acute pulmonary embolism.
2. No acute pulmonary parenchymal findings.

## 2020-02-11 ENCOUNTER — Other Ambulatory Visit: Payer: Self-pay

## 2020-02-11 ENCOUNTER — Encounter: Payer: Self-pay | Admitting: Emergency Medicine

## 2020-02-11 ENCOUNTER — Ambulatory Visit
Admission: EM | Admit: 2020-02-11 | Discharge: 2020-02-11 | Disposition: A | Discharge status: Home / Self Care | Payer: 59

## 2020-02-11 DIAGNOSIS — J069 Acute upper respiratory infection, unspecified: Secondary | ICD-10-CM

## 2020-02-11 MED ORDER — FLUTICASONE PROPIONATE 50 MCG/ACT NA SUSP: 1.0000 | Freq: Every day | NASAL | 0 refills | Status: AC

## 2020-02-11 MED ORDER — MUCINEX 600 MG PO TB12: 1200.0000 mg | ORAL_TABLET | Freq: Two times a day (BID) | ORAL | 0 refills | Status: AC | PRN

## 2020-02-11 NOTE — Discharge Instructions (Addendum)
Use the Flonase and take the Mucinex as directed.    Follow up with your primary care provider if your symptoms are not improving.

## 2020-02-11 NOTE — ED Triage Notes (Signed)
Patient in today c/o bilateral ear pain, L>R x 2 days. Patient also c/o sinus pressure and pain x 2 days. Patient has been taking Claritin D and Mucinex.

## 2020-02-11 NOTE — ED Provider Notes (Signed)
Cathy Payne    CSN: [id number] Arrival date & time: 02/11/20  1504      History   Chief Complaint Chief Complaint  Patient presents with  . Otalgia    L>R  . sinus pressure    HPI Cathy Payne is a 44 y.o. female.   Patient presents with bilateral ear pain, sinus pressure, congestion x2 days.  She has attempted treatment at home with Claritin and Mucinex.  She denies fever, sore throat, cough, shortness of breath, vomiting, diarrhea, rash, or other symptoms.  Patient was treated with Augmentin on 12/31/2019.  The history is provided by the patient.    Past Medical History:  Diagnosis Date  . ADHD (attention deficit hyperactivity disorder)   . Anxiety   . Bipolar 1 disorder (HCC)   . Pneumonia     There are no problems to display for this patient.   Past Surgical History:  Procedure Laterality Date  . CYST EXCISION    . DILATION AND CURETTAGE OF UTERUS  2001    OB History   No obstetric history on file.      Home Medications    Prior to Admission medications   Medication Sig Start Date End Date Taking? Authorizing Provider  acetaminophen (TYLENOL) 650 MG CR tablet Take 650 mg by mouth every 8 (eight) hours as needed for pain.   Yes [provider]  ibuprofen (ADVIL,MOTRIN) 800 MG tablet Take 1 tablet (800 mg total) by mouth every 8 (eight) hours as needed for mild pain or moderate pain. 04/14/15  Yes Renford Dills, NP  loratadine-pseudoephedrine (CLARITIN-D 12-HOUR) 5-120 MG tablet Take 1 tablet by mouth 2 (two) times daily.   Yes [provider]  amoxicillin-clavulanate (AUGMENTIN) 875-125 MG tablet Take 1 tablet by mouth 2 (two) times daily. One po bid x 7 days 09/24/19   Dartha Lodge, PA-C  amphetamine-dextroamphetamine (ADDERALL XR) 20 MG 24 hr capsule Take 20 mg by mouth every morning. 01/27/15   [provider]  buPROPion (WELLBUTRIN XL) 300 MG 24 hr tablet Take 300 mg by mouth daily. 01/29/15   [provider]  fluticasone (FLONASE) 50 MCG/ACT nasal spray Place 1 spray into both nostrils daily. 02/11/20   Mickie Bail, NP  guaiFENesin (MUCINEX) 600 MG 12 hr tablet Take 2 tablets (1,200 mg total) by mouth 2 (two) times daily as needed. 02/11/20   Mickie Bail, NP  ondansetron (ZOFRAN ODT) 4 MG disintegrating tablet Take 1 tablet (4 mg total) by mouth every 8 (eight) hours as needed for nausea or vomiting. 10/21/19   Henderly, Britni A, PA-C  phenazopyridine (PYRIDIUM) 100 MG tablet Take 1 tablet (100 mg total) by mouth 3 (three) times daily as needed for pain. 06/07/15   Minna Antis, MD  risperiDONE (RISPERDAL) 1 MG tablet Take 1 mg by mouth daily. 01/29/15   [provider]  traMADol (ULTRAM) 50 MG tablet Take 1 tablet (50 mg total) by mouth every 8 (eight) hours as needed (Do not drive or operate machinery while taking as can cause drowsiness.). 04/14/15   Renford Dills, NP  zolpidem (AMBIEN CR) 12.5 MG CR tablet Take 12.5 mg by mouth at bedtime as needed. 12/30/14   [provider]    Family History Family History  Problem Relation Age of Onset  . Depression Mother   . Breast cancer Mother   . Osteoarthritis Mother   . Hyperlipidemia Mother   . Other Mother  enlarged heart  . COPD Father   . Dementia Father   . Diabetes Father   . Hypertension Father   . Bipolar disorder Sister   . Post-traumatic stress disorder Sister   . COPD Sister     Social History Social History   Tobacco Use  . Smoking status: Current Every Day Smoker    Packs/day: 0.25    Years: 26.00    Pack years: 6.50    Types: Cigarettes  . Smokeless tobacco: Never Used  Substance Use Topics  . Alcohol use: Yes    Comment: occ  . Drug use: No     Allergies   Patient has no known allergies.   Review of Systems Review of Systems  Constitutional: Negative for chills and fever.  HENT: Positive for congestion, ear pain and sinus pressure. Negative for sore throat.   Eyes: Negative  for pain and visual disturbance.  Respiratory: Negative for cough and shortness of breath.   Cardiovascular: Negative for chest pain and palpitations.  Gastrointestinal: Negative for abdominal pain, diarrhea, nausea and vomiting.  Genitourinary: Negative for dysuria and hematuria.  Musculoskeletal: Negative for arthralgias and back pain.  Skin: Negative for color change and rash.  Neurological: Negative for seizures and syncope.  All other systems reviewed and are negative.    Physical Exam Triage Vital Signs ED Triage Vitals  Enc Vitals Group     BP      Pulse      Resp      Temp      Temp src      SpO2      Weight      Height      Head Circumference      Peak Flow      Pain Score      Pain Loc      Pain Edu?      Excl. in [place]?    No data found.  Updated Vital Signs BP 140/78 (BP Location: Left Arm)   Pulse 88   Temp 98.9 F (37.2 C) (Oral)   Resp 18   Ht 5\' 8"  (1.727 m)   Wt 230 lb (104.3 kg)   LMP 06/13/2019 (Approximate)   SpO2 97%   BMI 34.97 kg/m   Visual Acuity Right Eye Distance:   Left Eye Distance:   Bilateral Distance:    Right Eye Near:   Left Eye Near:    Bilateral Near:     Physical Exam Vitals and nursing note reviewed.  Constitutional:      General: She is not in acute distress.    Appearance: She is well-developed.  HENT:     Head: Normocephalic and atraumatic.     Left Ear: Tympanic membrane normal.     Ears:     Comments: Mild erythema of right TM.      Nose: Nose normal.     Mouth/Throat:     Mouth: Mucous membranes are moist.     Pharynx: Oropharynx is clear.  Eyes:     Conjunctiva/sclera: Conjunctivae normal.  Cardiovascular:     Rate and Rhythm: Normal rate and regular rhythm.     Heart sounds: No murmur.  Pulmonary:     Effort: Pulmonary effort is normal. No respiratory distress.     Breath sounds: Normal breath sounds.  Abdominal:     Palpations: Abdomen is soft.     Tenderness: There is no abdominal tenderness.  There is no guarding or rebound.  Musculoskeletal:  Cervical back: Neck supple.  Skin:    General: Skin is warm and dry.     Findings: No rash.  Neurological:     General: No focal deficit present.     Mental Status: She is alert and oriented to person, place, and time.  Psychiatric:        Mood and Affect: Mood normal.        Behavior: Behavior normal.      UC Treatments / Results  Labs (all labs ordered are listed, but only abnormal results are displayed) Labs Reviewed - No data to display  EKG   Radiology No results found.  Procedures Procedures (including critical care time)  Medications Ordered in UC Medications - No data to display  Initial Impression / Assessment and Plan / UC Course  I have reviewed the triage vital signs and the nursing notes.  Pertinent labs & imaging results that were available during my care of the patient were reviewed by me and considered in my medical decision making (see chart for details).   URI.  Treating with Flonase and Mucinex.  No antibiotic today due to brief duration of symptoms and recent antibiotic use.  Instructed patient to follow-up with her PCP if her symptoms are not improving.  Patient agrees to plan of care.     Final Clinical Impressions(s) / UC Diagnoses   Final diagnoses:  Upper respiratory tract infection, unspecified type     Discharge Instructions     Use the Flonase and take the Mucinex as directed.    Follow up with your primary care provider if your symptoms are not improving.        ED Prescriptions    Medication Sig Dispense Auth. Provider   fluticasone (FLONASE) 50 MCG/ACT nasal spray Place 1 spray into both nostrils daily. 16 g Mickie Bail, NP   guaiFENesin (MUCINEX) 600 MG 12 hr tablet Take 2 tablets (1,200 mg total) by mouth 2 (two) times daily as needed. 12 tablet Mickie Bail, NP     PDMP not reviewed this encounter.   Mickie Bail, NP 02/11/20 1558

## 2023-10-20 ENCOUNTER — Encounter: Payer: Self-pay | Admitting: Emergency Medicine

## 2023-10-20 ENCOUNTER — Ambulatory Visit
Admission: EM | Admit: 2023-10-20 | Discharge: 2023-10-20 | Disposition: A | Discharge status: Home / Self Care | Payer: 59 | Attending: Physician Assistant | Admitting: Physician Assistant

## 2023-10-20 DIAGNOSIS — R051 Acute cough: Secondary | ICD-10-CM

## 2023-10-20 DIAGNOSIS — R0981 Nasal congestion: Secondary | ICD-10-CM

## 2023-10-20 DIAGNOSIS — J069 Acute upper respiratory infection, unspecified: Secondary | ICD-10-CM

## 2023-10-20 LAB — POC COVID19/FLU A&B COMBO
Covid Antigen, POC: NEGATIVE
Influenza A Antigen, POC: NEGATIVE
Influenza B Antigen, POC: NEGATIVE

## 2023-10-20 MED ORDER — BENZONATATE 100 MG PO CAPS: 100.0000 mg | ORAL_CAPSULE | Freq: Three times a day (TID) | ORAL | 0 refills | Status: AC

## 2023-10-20 MED ORDER — IPRATROPIUM BROMIDE 0.03 % NA SOLN: 2.0000 | Freq: Two times a day (BID) | NASAL | 0 refills | Status: AC

## 2023-10-20 NOTE — Discharge Instructions (Signed)
You are negative for COVID and flu.  I suspect you have a different virus.  Start Tessalon for cough.  Use ipratropium nasal spray to help with your congestion.  I recommend nasal saline sinus rinses for additional symptom relief.  Make sure that you are resting and drinking plenty of fluid.  If your symptoms not improving within 5 to 7 days or if anything worsens and you have high fever, worsening cough, chest pain, nausea, vomiting you need to be seen immediately.

## 2023-10-20 NOTE — ED Provider Notes (Signed)
[facility]    CSN: [id number] Arrival date & time: 10/20/23  1442      History   Chief Complaint Chief Complaint  Patient presents with   Cough   sinus pressure   Generalized Body Aches    HPI Cathy Payne is a 47 y.o. female.   Patient presents today with a several day history of URI symptoms.  She reports sinus pressure, body aches, cough.  Denies chest pain, shortness of breath, nausea, vomiting, diarrhea.  Does report a family member was recently diagnosed with walking pneumonia but denies additional known sick contacts.  She has had COVID several years ago.  She has been using over-the-counter medication including multisymptom medication and Advil cold/flu without improvement of symptoms.  She denies any recent antibiotics or steroids.  She denies history of allergies, asthma, COPD.  She is a former smoker but has gradually been decreasing her daily allowance of cigarettes with her last cigarette over a week ago.  She has no concern for pregnancy.  She is eating and drinking normally.    Past Medical History:  Diagnosis Date   ADHD (attention deficit hyperactivity disorder)    Anxiety    Bipolar 1 disorder (HCC)    Pneumonia     There are no problems to display for this patient.   Past Surgical History:  Procedure Laterality Date   CYST EXCISION     DILATION AND CURETTAGE OF UTERUS  2001    OB History   No obstetric history on file.      Home Medications    Prior to Admission medications   Medication Sig Start Date End Date Taking? Authorizing Provider  benzonatate (TESSALON) 100 MG capsule Take 1 capsule (100 mg total) by mouth every 8 (eight) hours. 10/20/23  Yes Penney Domanski K, PA-C  ipratropium (ATROVENT) 0.03 % nasal spray Place 2 sprays into both nostrils every 12 (twelve) hours. 10/20/23  Yes Maddisyn Hegwood, Noberto Retort, PA-C  acetaminophen (TYLENOL) 650 MG CR tablet Take 650 mg by mouth every 8 (eight) hours as needed for pain.    [provider]  amphetamine-dextroamphetamine (ADDERALL XR) 20 MG 24 hr capsule Take 20 mg by mouth every morning. 01/27/15   [provider]  buPROPion (WELLBUTRIN XL) 300 MG 24 hr tablet Take 300 mg by mouth daily. 01/29/15   [provider]  fluticasone (FLONASE) 50 MCG/ACT nasal spray Place 1 spray into both nostrils daily. 02/11/20   Mickie Bail, NP  guaiFENesin (MUCINEX) 600 MG 12 hr tablet Take 2 tablets (1,200 mg total) by mouth 2 (two) times daily as needed. 02/11/20   Mickie Bail, NP  ibuprofen (ADVIL,MOTRIN) 800 MG tablet Take 1 tablet (800 mg total) by mouth every 8 (eight) hours as needed for mild pain or moderate pain. 04/14/15   Renford Dills, NP  loratadine-pseudoephedrine (CLARITIN-D 12-HOUR) 5-120 MG tablet Take 1 tablet by mouth 2 (two) times daily.    [provider]  phenazopyridine (PYRIDIUM) 100 MG tablet Take 1 tablet (100 mg total) by mouth 3 (three) times daily as needed for pain. 06/07/15   Minna Antis, MD  risperiDONE (RISPERDAL) 1 MG tablet Take 1 mg by mouth daily. 01/29/15   [provider]  traMADol (ULTRAM) 50 MG tablet Take 1 tablet (50 mg total) by mouth every 8 (eight) hours as needed (Do not drive or operate machinery while taking as can cause drowsiness.). 04/14/15   Renford Dills, NP  zolpidem (AMBIEN CR) 12.5  MG CR tablet Take 12.5 mg by mouth at bedtime as needed. 12/30/14   [provider]    Family History Family History  Problem Relation Age of Onset   Depression Mother    Breast cancer Mother    Osteoarthritis Mother    Hyperlipidemia Mother    Other Mother        enlarged heart   COPD Father    Dementia Father    Diabetes Father    Hypertension Father    Bipolar disorder Sister    Post-traumatic stress disorder Sister    COPD Sister     Social History Social History   Tobacco Use   Smoking status: Former    Current packs/day: 0.25    Average packs/day: 0.3 packs/day for 26.0 years (6.5 ttl  pk-yrs)    Types: Cigarettes   Smokeless tobacco: Never  Vaping Use   Vaping status: Former   Substances: Nicotine, Flavoring  Substance Use Topics   Alcohol use: Yes    Comment: occ   Drug use: No     Allergies   Acetaminophen   Review of Systems Review of Systems  Constitutional:  Positive for activity change, chills and fatigue. Negative for appetite change and fever.  HENT:  Positive for congestion and sinus pressure. Negative for sneezing and sore throat.   Respiratory:  Positive for cough. Negative for shortness of breath.   Cardiovascular:  Negative for chest pain.  Gastrointestinal:  Negative for abdominal pain, diarrhea, nausea and vomiting.  Musculoskeletal:  Positive for arthralgias and myalgias.  Neurological:  Positive for headaches. Negative for dizziness and light-headedness.     Physical Exam Triage Vital Signs ED Triage Vitals  Encounter Vitals Group     BP 10/20/23 1530 (!) 145/83     Systolic BP Percentile --      Diastolic BP Percentile --      Pulse Rate 10/20/23 1530 89     Resp 10/20/23 1530 18     Temp 10/20/23 1530 98.9 F (37.2 C)     Temp Source 10/20/23 1530 Oral     SpO2 10/20/23 1530 95 %     Weight --      Height --      Head Circumference --      Peak Flow --      Pain Score 10/20/23 1529 0     Pain Loc --      Pain Education --      Exclude from Growth Chart --    No data found.  Updated Vital Signs BP (!) 145/83 (BP Location: Left Arm)   Pulse 89   Temp 98.9 F (37.2 C) (Oral)   Resp 18   SpO2 95%   Visual Acuity Right Eye Distance:   Left Eye Distance:   Bilateral Distance:    Right Eye Near:   Left Eye Near:    Bilateral Near:     Physical Exam Vitals reviewed.  Constitutional:      General: She is awake. She is not in acute distress.    Appearance: Normal appearance. She is well-developed. She is not ill-appearing.     Comments: Very pleasant female appears stated age in no acute distress sitting  comfortably in exam room  HENT:     Head: Normocephalic and atraumatic.     Right Ear: Tympanic membrane, ear canal and external ear normal. Tympanic membrane is not erythematous or bulging.     Left Ear: Tympanic membrane, ear canal  and external ear normal. Tympanic membrane is not erythematous or bulging.     Nose:     Right Sinus: Maxillary sinus tenderness present. No frontal sinus tenderness.     Left Sinus: Maxillary sinus tenderness present. No frontal sinus tenderness.     Mouth/Throat:     Pharynx: Uvula midline. No oropharyngeal exudate or posterior oropharyngeal erythema.  Cardiovascular:     Rate and Rhythm: Normal rate and regular rhythm.     Heart sounds: Normal heart sounds, S1 normal and S2 normal. No murmur heard. Pulmonary:     Effort: Pulmonary effort is normal.     Breath sounds: Normal breath sounds. No wheezing, rhonchi or rales.     Comments: Clear to auscultation bilaterally Psychiatric:        Behavior: Behavior is cooperative.      UC Treatments / Results  Labs (all labs ordered are listed, but only abnormal results are displayed) Labs Reviewed  POC COVID19/FLU A&B COMBO    EKG   Radiology No results found.  Procedures Procedures (including critical care time)  Medications Ordered in UC Medications - No data to display  Initial Impression / Assessment and Plan / UC Course  I have reviewed the triage vital signs and the nursing notes.  Pertinent labs & imaging results that were available during my care of the patient were reviewed by me and considered in my medical decision making (see chart for details).     Patient is well-appearing, afebrile, nontoxic, nontachycardic.  No evidence of acute infection on physical exam that would warrant initiation of antibiotics.  COVID and flu testing were negative.  Discussed likely viral etiology.  Will treat symptomatically.  She was started on Tessalon for cough and ipratropium for nasal congestion.  She  can use over-the-counter medication including Mucinex, Flonase, Tylenol with nasal saline and sinus rinses for additional symptom relief.  Chest x-ray was deferred as she had no adventitious lung sounds and her oxygen saturation was 95%.  We discussed that if her symptoms are not improving within 5 to 7 days she should return for reevaluation or if she develops any worsening symptoms including high fever, worsening cough, chest pain, shortness of breath, nausea, vomiting.  Strict return precautions given.  Excuse note provided.  Final Clinical Impressions(s) / UC Diagnoses   Final diagnoses:  Upper respiratory tract infection, unspecified type  Nasal congestion  Acute cough     Discharge Instructions      You are negative for COVID and flu.  I suspect you have a different virus.  Start Tessalon for cough.  Use ipratropium nasal spray to help with your congestion.  I recommend nasal saline sinus rinses for additional symptom relief.  Make sure that you are resting and drinking plenty of fluid.  If your symptoms not improving within 5 to 7 days or if anything worsens and you have high fever, worsening cough, chest pain, nausea, vomiting you need to be seen immediately.     ED Prescriptions     Medication Sig Dispense Auth. Provider   ipratropium (ATROVENT) 0.03 % nasal spray Place 2 sprays into both nostrils every 12 (twelve) hours. 30 mL Lonette Stevison K, PA-C   benzonatate (TESSALON) 100 MG capsule Take 1 capsule (100 mg total) by mouth every 8 (eight) hours. 21 capsule Bernice Mullin K, PA-C      PDMP not reviewed this encounter.   Jeani Hawking, PA-C 10/20/23 1614

## 2023-10-20 NOTE — ED Triage Notes (Signed)
Pt presents with a cough, sinus pressure and body aches that started today.

## 2024-01-12 ENCOUNTER — Ambulatory Visit
Admission: EM | Admit: 2024-01-12 | Discharge: 2024-01-12 | Disposition: A | Discharge status: Home / Self Care | Payer: Self-pay | Attending: Emergency Medicine | Admitting: Emergency Medicine

## 2024-01-12 ENCOUNTER — Encounter: Payer: Self-pay | Admitting: *Deleted

## 2024-01-12 DIAGNOSIS — R809 Proteinuria, unspecified: Secondary | ICD-10-CM

## 2024-01-12 DIAGNOSIS — R319 Hematuria, unspecified: Secondary | ICD-10-CM

## 2024-01-12 DIAGNOSIS — B349 Viral infection, unspecified: Secondary | ICD-10-CM

## 2024-01-12 LAB — POCT URINALYSIS DIP (MANUAL ENTRY)
Bilirubin, UA: NEGATIVE
Glucose, UA: NEGATIVE mg/dL
Ketones, POC UA: NEGATIVE mg/dL
Leukocytes, UA: NEGATIVE
Nitrite, UA: NEGATIVE
Protein Ur, POC: >=300 mg/dL — AB
Spec Grav, UA: >=1.03 — AB
Urobilinogen, UA: NEGATIVE U/dL — AB
pH, UA: 5.5

## 2024-01-12 LAB — POC COVID19/FLU A&B COMBO
Covid Antigen, POC: NEGATIVE
Influenza A Antigen, POC: NEGATIVE
Influenza B Antigen, POC: NEGATIVE

## 2024-01-12 LAB — POCT RAPID STREP A (OFFICE): Rapid Strep A Screen: NEGATIVE

## 2024-01-12 MED ORDER — ALBUTEROL SULFATE HFA 108 (90 BASE) MCG/ACT IN AERS: 1.0000 | INHALATION_SPRAY | Freq: Four times a day (QID) | RESPIRATORY_TRACT | 0 refills | Status: AC | PRN

## 2024-01-12 NOTE — ED Triage Notes (Addendum)
 Patient states cough/congestion, headache and fever for 3 days.  Taking OTC cough meds with no relief.  Also endorses urinary frequency and strong odor for 2 days.

## 2024-01-12 NOTE — ED Provider Notes (Signed)
 [facility]    CSN: [id number] Arrival date & time: 01/12/24  1401      History   Chief Complaint Chief Complaint  Patient presents with   Sore Throat   Headache   Cough    HPI Cathy Payne is a 48 y.o. female.  Patient presents with 3 to 4-day history of congestion, postnasal drip, sore throat, cough.  She had a fever and diarrhea at the onset of her symptoms but none in the last 2 days.  She has been taking OTC cold and sinus medication.  Patient also presents with malodorous urine and urinary frequency x 2 days.  She denies abdominal pain, dysuria, hematuria, flank pain.  LMP age 50. The history is provided by the patient and medical records.    Past Medical History:  Diagnosis Date   ADHD (attention deficit hyperactivity disorder)    Anxiety    Bipolar 1 disorder (HCC)    Pneumonia     There are no active problems to display for this patient.   Past Surgical History:  Procedure Laterality Date   CYST EXCISION     DILATION AND CURETTAGE OF UTERUS  2001    OB History   No obstetric history on file.      Home Medications    Prior to Admission medications   Medication Sig Start Date End Date Taking? Authorizing Provider  albuterol (VENTOLIN HFA) 108 (90 Base) MCG/ACT inhaler Inhale 1-2 puffs into the lungs every 6 (six) hours as needed. 01/12/24  Yes Mickie Bail, NP  acetaminophen (TYLENOL) 650 MG CR tablet Take 650 mg by mouth every 8 (eight) hours as needed for pain.    [provider]  amphetamine-dextroamphetamine (ADDERALL XR) 20 MG 24 hr capsule Take 20 mg by mouth every morning. 01/27/15   [provider]  benzonatate (TESSALON) 100 MG capsule Take 1 capsule (100 mg total) by mouth every 8 (eight) hours. 10/20/23   Raspet, Noberto Retort, PA-C  buPROPion (WELLBUTRIN XL) 300 MG 24 hr tablet Take 300 mg by mouth daily. 01/29/15   [provider]  fluticasone (FLONASE) 50 MCG/ACT nasal spray Place 1 spray into both nostrils  daily. 02/11/20   Mickie Bail, NP  guaiFENesin (MUCINEX) 600 MG 12 hr tablet Take 2 tablets (1,200 mg total) by mouth 2 (two) times daily as needed. 02/11/20   Mickie Bail, NP  ibuprofen (ADVIL,MOTRIN) 800 MG tablet Take 1 tablet (800 mg total) by mouth every 8 (eight) hours as needed for mild pain or moderate pain. 04/14/15   Renford Dills, NP  ipratropium (ATROVENT) 0.03 % nasal spray Place 2 sprays into both nostrils every 12 (twelve) hours. 10/20/23   Raspet, Noberto Retort, PA-C  loratadine-pseudoephedrine (CLARITIN-D 12-HOUR) 5-120 MG tablet Take 1 tablet by mouth 2 (two) times daily.    [provider]  phenazopyridine (PYRIDIUM) 100 MG tablet Take 1 tablet (100 mg total) by mouth 3 (three) times daily as needed for pain. 06/07/15   Minna Antis, MD  risperiDONE (RISPERDAL) 1 MG tablet Take 1 mg by mouth daily. 01/29/15   [provider]  traMADol (ULTRAM) 50 MG tablet Take 1 tablet (50 mg total) by mouth every 8 (eight) hours as needed (Do not drive or operate machinery while taking as can cause drowsiness.). 04/14/15   Renford Dills, NP  zolpidem (AMBIEN CR) 12.5 MG CR tablet Take 12.5 mg by mouth at bedtime as needed. 12/30/14   [provider]  Family History Family History  Problem Relation Age of Onset   Depression Mother    Breast cancer Mother    Osteoarthritis Mother    Hyperlipidemia Mother    Other Mother        enlarged heart   COPD Father    Dementia Father    Diabetes Father    Hypertension Father    Bipolar disorder Sister    Post-traumatic stress disorder Sister    COPD Sister     Social History Social History   Tobacco Use   Smoking status: Former    Current packs/day: 0.25    Average packs/day: 0.3 packs/day for 26.0 years (6.5 ttl pk-yrs)    Types: Cigarettes   Smokeless tobacco: Never  Vaping Use   Vaping status: Former   Substances: Nicotine, Flavoring  Substance Use Topics   Alcohol use: Yes    Comment: occ   Drug  use: No     Allergies   Acetaminophen   Review of Systems Review of Systems  Constitutional:  Positive for fever. Negative for chills.  HENT:  Positive for congestion, postnasal drip and sore throat. Negative for ear pain.   Respiratory:  Positive for cough. Negative for shortness of breath.   Cardiovascular:  Negative for chest pain and palpitations.  Gastrointestinal:  Positive for diarrhea. Negative for abdominal pain, nausea and vomiting.  Genitourinary:  Positive for frequency. Negative for dysuria, flank pain and hematuria.     Physical Exam Triage Vital Signs ED Triage Vitals [01/12/24 1441]  Encounter Vitals Group     BP      Systolic BP Percentile      Diastolic BP Percentile      Pulse      Resp      Temp      Temp src      SpO2      Weight 270 lb (122.5 kg)     Height 5\' 8"  (1.727 m)     Head Circumference      Peak Flow      Pain Score 5     Pain Loc      Pain Education      Exclude from Growth Chart    No data found.  Updated Vital Signs BP 129/82 (BP Location: Left Arm)   Pulse 93   Temp 99.3 F (37.4 C) (Oral)   Resp 18   Ht 5\' 8"  (1.727 m)   Wt 270 lb (122.5 kg)   SpO2 97%   BMI 41.05 kg/m   Visual Acuity Right Eye Distance:   Left Eye Distance:   Bilateral Distance:    Right Eye Near:   Left Eye Near:    Bilateral Near:     Physical Exam Constitutional:      General: She is not in acute distress.    Appearance: She is obese.  HENT:     Right Ear: Tympanic membrane normal.     Left Ear: Tympanic membrane normal.     Nose: Rhinorrhea present.     Mouth/Throat:     Mouth: Mucous membranes are moist.     Pharynx: Posterior oropharyngeal erythema present.  Cardiovascular:     Rate and Rhythm: Normal rate and regular rhythm.     Heart sounds: Normal heart sounds.  Pulmonary:     Effort: Pulmonary effort is normal. No respiratory distress.     Breath sounds: Normal breath sounds.  Abdominal:     General: Bowel sounds are  normal.     Palpations: Abdomen is soft.     Tenderness: There is no abdominal tenderness. There is no right CVA tenderness, left CVA tenderness, guarding or rebound.  Neurological:     Mental Status: She is alert.      UC Treatments / Results  Labs (all labs ordered are listed, but only abnormal results are displayed) Labs Reviewed  POCT URINALYSIS DIP (MANUAL ENTRY) - Abnormal; Notable for the following components:      Result Value   Color, UA brown (*)    Spec Grav, UA >=1.030 (*)    Blood, UA large (*)    Protein Ur, POC >=300 (*)    Urobilinogen, UA negative (*)    All other components within normal limits  POCT RAPID STREP A (OFFICE)  POC COVID19/FLU A&B COMBO    EKG   Radiology No results found.  Procedures Procedures (including critical care time)  Medications Ordered in UC Medications - No data to display  Initial Impression / Assessment and Plan / UC Course  I have reviewed the triage vital signs and the nursing notes.  Pertinent labs & imaging results that were available during my care of the patient were reviewed by me and considered in my medical decision making (see chart for details).   Viral illness, hematuria, proteinuria.  Afebrile and vital signs are stable.  Abdomen is soft and nontender.  Urine does not show signs of infection but is positive for blood and protein.  Discussed with patient that this needs to be rechecked by her PCP.  Rapid flu and COVID-negative.  Per patient request, albuterol inhaler prescribed.  Discussed symptomatic treatment including ibuprofen and plain Mucinex.  Instructed patient to follow-up with her PCP tomorrow.  ED precautions given.  Education provided on viral illness, hematuria, proteinuria.  Patient agrees to plan of care.  Final Clinical Impressions(s) / UC Diagnoses   Final diagnoses:  Viral illness  Hematuria, unspecified type  Proteinuria, unspecified type     Discharge Instructions      Use the  albuterol inhaler as directed.    The strep, COVID, flu tests are negative.  Take ibuprofen and plain Mucinex as needed for your symptoms.    Your urine does not show signs of infection but it does have blood and protein.  This needs to be rechecked by your primary care provider.    Follow up with your primary care provider tomorrow.  Go to the emergency department if you have worsening symptoms.        ED Prescriptions     Medication Sig Dispense Auth. Provider   albuterol (VENTOLIN HFA) 108 (90 Base) MCG/ACT inhaler Inhale 1-2 puffs into the lungs every 6 (six) hours as needed. 18 g Mickie Bail, NP      PDMP not reviewed this encounter.   Mickie Bail, NP 01/12/24 [phone]

## 2024-01-12 NOTE — Discharge Instructions (Addendum)
 Use the albuterol inhaler as directed.    The strep, COVID, flu tests are negative.  Take ibuprofen and plain Mucinex as needed for your symptoms.    Your urine does not show signs of infection but it does have blood and protein.  This needs to be rechecked by your primary care provider.    Follow up with your primary care provider tomorrow.  Go to the emergency department if you have worsening symptoms.

## 2024-07-28 ENCOUNTER — Encounter: Payer: Self-pay | Admitting: Emergency Medicine

## 2024-07-28 ENCOUNTER — Ambulatory Visit
Admission: EM | Admit: 2024-07-28 | Discharge: 2024-07-28 | Disposition: A | Discharge status: Home / Self Care | Payer: Self-pay | Attending: Emergency Medicine | Admitting: Emergency Medicine

## 2024-07-28 DIAGNOSIS — B349 Viral infection, unspecified: Secondary | ICD-10-CM

## 2024-07-28 DIAGNOSIS — J209 Acute bronchitis, unspecified: Secondary | ICD-10-CM

## 2024-07-28 MED ORDER — AMOXICILLIN-POT CLAVULANATE 875-125 MG PO TABS: 1.0000 | ORAL_TABLET | Freq: Two times a day (BID) | ORAL | 0 refills | Status: AC

## 2024-07-28 MED ORDER — PROMETHAZINE-DM 6.25-15 MG/5ML PO SYRP: 5.0000 mL | ORAL_SOLUTION | Freq: Every evening | ORAL | 0 refills | Status: AC | PRN

## 2024-07-28 MED ORDER — PREDNISONE 10 MG (21) PO TBPK: ORAL_TABLET | Freq: Every day | ORAL | 0 refills | Status: AC

## 2024-07-28 NOTE — ED Triage Notes (Signed)
 Patient tested positive for COVID on 07/20/2024 with home test.  Patient reports cough and congestion x 3 days.

## 2024-07-28 NOTE — Discharge Instructions (Signed)
 Symptoms have persisted for at least 9 days without any signs of improvement and has begun to worsen therefore he was placed on antibiotic  Take Augmentin  twice daily for 7 days  Begin prednisone  every morning with food to open and relax the airway, should help with shortness of breath and wheezing  May use cough syrup at bedtime as needed to allow for rest  You can take Tylenol  and/or Ibuprofen  as needed for fever reduction and pain relief.   For cough: honey 1/2 to 1 teaspoon (you can dilute the honey in water or another fluid).  You can also use guaifenesin  and dextromethorphan for cough. You can use a humidifier for chest congestion and cough.  If you don't have a humidifier, you can sit in the bathroom with the hot shower running.      For sore throat: try warm salt water gargles, cepacol lozenges, throat spray, warm tea or water with lemon/honey, popsicles or ice, or OTC cold relief medicine for throat discomfort.   For congestion: take a daily anti-histamine like Zyrtec, Claritin, and a oral decongestant, such as pseudoephedrine.  You can also use Flonase  1-2 sprays in each nostril daily.   It is important to stay hydrated: drink plenty of fluids (water, gatorade/powerade/pedialyte, juices, or teas) to keep your throat moisturized and help further relieve irritation/discomfort.

## 2024-07-28 NOTE — ED Provider Notes (Signed)
 Cathy Payne    CSN: [id number] Arrival date & time: 07/28/24  1443      History   Chief Complaint Chief Complaint  Patient presents with   Cough    HPI Cathy Payne is a 48 y.o. female.   Patient presents for evaluation of fever, nasal congestion, productive cough, left-sided sinus pressure, left-sided ear fullness present for at least 9 days.  Endorses with quick movements head pressure causes mild dizziness.  Has had decrease of appetite due to loss of taste and smell, somewhat improved.  Over the past 3 days has begun to experience shortness of breath with exertion, chest tightness and a burning sensation and wheezing only with coughing.  Home COVID test positive on 07/20/2024.  Has attempted use of a decongestant and Advil  cold and flu which while helpful symptoms have begun to worsen.  History of pneumonia.  Non-smoker.  Past Medical History:  Diagnosis Date   ADHD (attention deficit hyperactivity disorder)    Anxiety    Bipolar 1 disorder (HCC)    Pneumonia     There are no active problems to display for this patient.   Past Surgical History:  Procedure Laterality Date   CYST EXCISION     DILATION AND CURETTAGE OF UTERUS  2001    OB History   No obstetric history on file.      Home Medications    Prior to Admission medications   Medication Sig Start Date End Date Taking? Authorizing Provider  amoxicillin -clavulanate (AUGMENTIN ) 875-125 MG tablet Take 1 tablet by mouth every 12 (twelve) hours. 07/28/24  Yes Carols Clemence R, NP  predniSONE  (STERAPRED UNI-PAK 21 TAB) 10 MG (21) TBPK tablet Take by mouth daily. Take 6 tabs by mouth daily  for 1 days, then 5 tabs for 1 days, then 4 tabs for 1 days, then 3 tabs for 1 days, 2 tabs for 1 days, then 1 tab by mouth daily for 1 days 07/28/24  Yes Naturi Alarid R, NP  promethazine -dextromethorphan (PROMETHAZINE -DM) 6.25-15 MG/5ML syrup Take 5 mLs by mouth at bedtime as needed. 07/28/24  Yes Chandni Gagan R, NP   acetaminophen  (TYLENOL ) 650 MG CR tablet Take 650 mg by mouth every 8 (eight) hours as needed for pain.    [provider]  albuterol  (VENTOLIN  HFA) 108 (90 Base) MCG/ACT inhaler Inhale 1-2 puffs into the lungs every 6 (six) hours as needed. 01/12/24   Corlis Burnard DEL, NP  amphetamine-dextroamphetamine (ADDERALL XR) 20 MG 24 hr capsule Take 20 mg by mouth every morning. 01/27/15   [provider]  benzonatate  (TESSALON ) 100 MG capsule Take 1 capsule (100 mg total) by mouth every 8 (eight) hours. 10/20/23   Raspet, Erin K, PA-C  buPROPion (WELLBUTRIN XL) 300 MG 24 hr tablet Take 300 mg by mouth daily. 01/29/15   [provider]  fluticasone  (FLONASE ) 50 MCG/ACT nasal spray Place 1 spray into both nostrils daily. 02/11/20   Corlis Burnard DEL, NP  guaiFENesin  (MUCINEX ) 600 MG 12 hr tablet Take 2 tablets (1,200 mg total) by mouth 2 (two) times daily as needed. 02/11/20   Corlis Burnard DEL, NP  ibuprofen  (ADVIL ,MOTRIN ) 800 MG tablet Take 1 tablet (800 mg total) by mouth every 8 (eight) hours as needed for mild pain or moderate pain. 04/14/15   Cleotilde Jacobsen, NP  ipratropium (ATROVENT ) 0.03 % nasal spray Place 2 sprays into both nostrils every 12 (twelve) hours. 10/20/23   Raspet, Erin K, PA-C  loratadine-pseudoephedrine (CLARITIN-D 12-HOUR) 5-120  MG tablet Take 1 tablet by mouth 2 (two) times daily.    [provider]  phenazopyridine  (PYRIDIUM ) 100 MG tablet Take 1 tablet (100 mg total) by mouth 3 (three) times daily as needed for pain. 06/07/15   Paduchowski, Kevin, MD  risperiDONE (RISPERDAL) 1 MG tablet Take 1 mg by mouth daily. 01/29/15   [provider]  traMADol  (ULTRAM ) 50 MG tablet Take 1 tablet (50 mg total) by mouth every 8 (eight) hours as needed (Do not drive or operate machinery while taking as can cause drowsiness.). 04/14/15   Cleotilde Jacobsen, NP  zolpidem (AMBIEN CR) 12.5 MG CR tablet Take 12.5 mg by mouth at bedtime as needed. 12/30/14   [provider]     Family History Family History  Problem Relation Age of Onset   Depression Mother    Breast cancer Mother    Osteoarthritis Mother    Hyperlipidemia Mother    Other Mother        enlarged heart   COPD Father    Dementia Father    Diabetes Father    Hypertension Father    Bipolar disorder Sister    Post-traumatic stress disorder Sister    COPD Sister     Social History Social History   Tobacco Use   Smoking status: Former    Current packs/day: 0.25    Average packs/day: 0.3 packs/day for 26.0 years (6.5 ttl pk-yrs)    Types: Cigarettes   Smokeless tobacco: Never  Vaping Use   Vaping status: Former   Substances: Nicotine , Flavoring  Substance Use Topics   Alcohol use: Yes    Comment: occ   Drug use: No     Allergies   Acetaminophen  and Mucinex  [guaifenesin  er]   Review of Systems Review of Systems  Respiratory:  Positive for cough.      Physical Exam Triage Vital Signs ED Triage Vitals  Encounter Vitals Group     BP 07/28/24 1515 130/79     Girls Systolic BP Percentile --      Girls Diastolic BP Percentile --      Boys Systolic BP Percentile --      Boys Diastolic BP Percentile --      Pulse Rate 07/28/24 1515 88     Resp 07/28/24 1515 20     Temp 07/28/24 1515 98.5 F (36.9 C)     Temp Source 07/28/24 1515 Oral     SpO2 07/28/24 1515 99 %     Weight --      Height --      Head Circumference --      Peak Flow --      Pain Score 07/28/24 1508 0     Pain Loc --      Pain Education --      Exclude from Growth Chart --    No data found.  Updated Vital Signs BP 130/79 (BP Location: Left Arm)   Pulse 88   Temp 98.5 F (36.9 C) (Oral)   Resp 20   SpO2 99%   Visual Acuity Right Eye Distance:   Left Eye Distance:   Bilateral Distance:    Right Eye Near:   Left Eye Near:    Bilateral Near:     Physical Exam Constitutional:      Appearance: Normal appearance.  HENT:     Right Ear: Tympanic membrane, ear canal and external ear  normal.     Left Ear: Tympanic membrane, ear canal and external  ear normal.     Nose: Congestion present.     Mouth/Throat:     Mouth: Mucous membranes are moist.     Pharynx: Oropharynx is clear.  Eyes:     Extraocular Movements: Extraocular movements intact.  Cardiovascular:     Rate and Rhythm: Normal rate and regular rhythm.     Pulses: Normal pulses.     Heart sounds: Normal heart sounds.  Pulmonary:     Effort: Pulmonary effort is normal.     Breath sounds: Normal breath sounds.  Musculoskeletal:     Cervical back: Normal range of motion and neck supple.  Skin:    General: Skin is warm and dry.  Neurological:     Mental Status: She is alert and oriented to person, place, and time. Mental status is at baseline.      UC Treatments / Results  Labs (all labs ordered are listed, but only abnormal results are displayed) Labs Reviewed - No data to display  EKG   Radiology No results found.  Procedures Procedures (including critical care time)  Medications Ordered in UC Medications - No data to display  Initial Impression / Assessment and Plan / UC Course  I have reviewed the triage vital signs and the nursing notes.  Pertinent labs & imaging results that were available during my care of the patient were reviewed by me and considered in my medical decision making (see chart for details).  Viral illness, acute bronchitis  Vital signs are stable and patient in no signs of distress nontoxic-appearing, lungs clear to auscultation, O2 saturation 99% on room air, stable for outpatient management, etiology viral however been has begun to worsen empirically placed on Augmentin  as well as prescribed prednisone  and Promethazine  DM, recommend over-the-counter medications and nonpharmacological supportive care, advised follow-up if symptoms continue to persist or worsen Final Clinical Impressions(s) / UC Diagnoses   Final diagnoses:  Viral illness  Acute bronchitis,  unspecified organism     Discharge Instructions      Symptoms have persisted for at least 9 days without any signs of improvement and has begun to worsen therefore he was placed on antibiotic  Take Augmentin  twice daily for 7 days  Begin prednisone  every morning with food to open and relax the airway, should help with shortness of breath and wheezing  May use cough syrup at bedtime as needed to allow for rest  You can take Tylenol  and/or Ibuprofen  as needed for fever reduction and pain relief.   For cough: honey 1/2 to 1 teaspoon (you can dilute the honey in water or another fluid).  You can also use guaifenesin  and dextromethorphan for cough. You can use a humidifier for chest congestion and cough.  If you don't have a humidifier, you can sit in the bathroom with the hot shower running.      For sore throat: try warm salt water gargles, cepacol lozenges, throat spray, warm tea or water with lemon/honey, popsicles or ice, or OTC cold relief medicine for throat discomfort.   For congestion: take a daily anti-histamine like Zyrtec, Claritin, and a oral decongestant, such as pseudoephedrine.  You can also use Flonase  1-2 sprays in each nostril daily.   It is important to stay hydrated: drink plenty of fluids (water, gatorade/powerade/pedialyte, juices, or teas) to keep your throat moisturized and help further relieve irritation/discomfort.    ED Prescriptions     Medication Sig Dispense Auth. Provider   amoxicillin -clavulanate (AUGMENTIN ) 875-125 MG tablet Take 1 tablet by  mouth every 12 (twelve) hours. 14 tablet Jrue Jarriel R, NP   predniSONE  (STERAPRED UNI-PAK 21 TAB) 10 MG (21) TBPK tablet Take by mouth daily. Take 6 tabs by mouth daily  for 1 days, then 5 tabs for 1 days, then 4 tabs for 1 days, then 3 tabs for 1 days, 2 tabs for 1 days, then 1 tab by mouth daily for 1 days 21 tablet Blayne Frankie R, NP   promethazine -dextromethorphan (PROMETHAZINE -DM) 6.25-15 MG/5ML syrup Take  5 mLs by mouth at bedtime as needed. 118 mL Micaylah Bertucci, Shelba SAUNDERS, NP      PDMP not reviewed this encounter.   Teresa Shelba SAUNDERS, [place] 07/28/24 [phone]
